# Patient Record
Sex: Female | Born: 1980 | Race: White | Hispanic: No | Marital: Married | Smoking: Former smoker
Health system: Southern US, Community
[De-identification: ages and names within clinical notes are randomized; demographics above are authoritative.]

## PROBLEM LIST (undated history)

## (undated) ENCOUNTER — Inpatient Hospital Stay: Payer: Self-pay

## (undated) DIAGNOSIS — F32A Depression, unspecified: Secondary | ICD-10-CM

## (undated) DIAGNOSIS — R799 Abnormal finding of blood chemistry, unspecified: Secondary | ICD-10-CM

## (undated) DIAGNOSIS — N979 Female infertility, unspecified: Secondary | ICD-10-CM

## (undated) DIAGNOSIS — B079 Viral wart, unspecified: Secondary | ICD-10-CM

## (undated) DIAGNOSIS — F329 Major depressive disorder, single episode, unspecified: Secondary | ICD-10-CM

## (undated) DIAGNOSIS — N926 Irregular menstruation, unspecified: Secondary | ICD-10-CM

## (undated) DIAGNOSIS — F17200 Nicotine dependence, unspecified, uncomplicated: Secondary | ICD-10-CM

## (undated) DIAGNOSIS — M25561 Pain in right knee: Secondary | ICD-10-CM

## (undated) DIAGNOSIS — J309 Allergic rhinitis, unspecified: Secondary | ICD-10-CM

## (undated) DIAGNOSIS — E049 Nontoxic goiter, unspecified: Secondary | ICD-10-CM

## (undated) DIAGNOSIS — D499 Neoplasm of unspecified behavior of unspecified site: Secondary | ICD-10-CM

## (undated) DIAGNOSIS — T7840XA Allergy, unspecified, initial encounter: Secondary | ICD-10-CM

## (undated) DIAGNOSIS — M25562 Pain in left knee: Secondary | ICD-10-CM

## (undated) DIAGNOSIS — O039 Complete or unspecified spontaneous abortion without complication: Secondary | ICD-10-CM

## (undated) HISTORY — DX: Nontoxic goiter, unspecified: E04.9

## (undated) HISTORY — DX: Pain in right knee: M25.562

## (undated) HISTORY — PX: OTHER SURGICAL HISTORY: SHX169

## (undated) HISTORY — DX: Female infertility, unspecified: N97.9

## (undated) HISTORY — DX: Allergy, unspecified, initial encounter: T78.40XA

## (undated) HISTORY — DX: Depression, unspecified: F32.A

## (undated) HISTORY — DX: Major depressive disorder, single episode, unspecified: F32.9

## (undated) HISTORY — DX: Abnormal finding of blood chemistry, unspecified: R79.9

## (undated) HISTORY — DX: Viral wart, unspecified: B07.9

## (undated) HISTORY — DX: Nicotine dependence, unspecified, uncomplicated: F17.200

## (undated) HISTORY — DX: Irregular menstruation, unspecified: N92.6

## (undated) HISTORY — DX: Pain in right knee: M25.561

## (undated) HISTORY — DX: Complete or unspecified spontaneous abortion without complication: O03.9

## (undated) HISTORY — DX: Allergic rhinitis, unspecified: J30.9

## (undated) HISTORY — DX: Neoplasm of unspecified behavior of unspecified site: D49.9

---

## 2004-10-01 ENCOUNTER — Ambulatory Visit: Payer: Self-pay | Admitting: General Practice

## 2007-06-08 ENCOUNTER — Ambulatory Visit: Payer: Self-pay | Admitting: Family Medicine

## 2009-12-02 ENCOUNTER — Ambulatory Visit: Payer: Self-pay | Admitting: Family Medicine

## 2011-11-01 ENCOUNTER — Observation Stay: Payer: Self-pay | Admitting: Obstetrics and Gynecology

## 2011-11-01 ENCOUNTER — Emergency Department: Payer: Self-pay | Admitting: *Deleted

## 2011-11-01 LAB — COMPREHENSIVE METABOLIC PANEL
Albumin: 2.6 g/dL — ABNORMAL LOW (ref 3.4–5.0)
Alkaline Phosphatase: 164 U/L — ABNORMAL HIGH (ref 50–136)
Alkaline Phosphatase: 159 U/L — ABNORMAL HIGH (ref 50–136)
BUN: 5 mg/dL — ABNORMAL LOW (ref 7–18)
Bilirubin,Total: 0.3 mg/dL (ref 0.2–1.0)
Calcium, Total: 8.5 mg/dL (ref 8.5–10.1)
Chloride: 108 mmol/L — ABNORMAL HIGH (ref 98–107)
Co2: 21 mmol/L (ref 21–32)
Creatinine: 0.5 mg/dL — ABNORMAL LOW (ref 0.60–1.30)
Creatinine: 0.47 mg/dL — ABNORMAL LOW (ref 0.60–1.30)
EGFR (African American): >60
EGFR (Non-African Amer.): >60
EGFR (Non-African Amer.): >60
Glucose: 98 mg/dL (ref 65–99)
Osmolality: 273 (ref 275–301)
SGOT(AST): 17 U/L (ref 15–37)
SGPT (ALT): 22 U/L (ref 12–78)
SGPT (ALT): 22 U/L (ref 12–78)
Sodium: 138 mmol/L (ref 136–145)
Total Protein: 6.8 g/dL (ref 6.4–8.2)

## 2011-11-01 LAB — CBC WITH DIFFERENTIAL/PLATELET
Eosinophil #: 0.1 10*3/uL (ref 0.0–0.7)
Eosinophil %: 0.3 %
MCH: 32 pg (ref 26.0–34.0)
MCHC: 35.4 g/dL (ref 32.0–36.0)
Monocyte #: 0.8 x10 3/mm (ref 0.2–0.9)
Neutrophil #: 17.1 10*3/uL — ABNORMAL HIGH (ref 1.4–6.5)
Neutrophil %: 86.1 %
Platelet: 333 10*3/uL (ref 150–440)
RBC: 3.52 10*6/uL — ABNORMAL LOW (ref 3.80–5.20)

## 2011-11-01 LAB — URINALYSIS, COMPLETE
Bilirubin,UR: NEGATIVE
Blood: NEGATIVE
Blood: NEGATIVE
Glucose,UR: NEGATIVE mg/dL (ref 0–75)
Glucose,UR: NEGATIVE mg/dL (ref 0–75)
Leukocyte Esterase: NEGATIVE
Nitrite: NEGATIVE
Nitrite: NEGATIVE
Ph: 7 (ref 4.5–8.0)
Protein: NEGATIVE
Protein: NEGATIVE
RBC,UR: <1 /HPF (ref 0–5)
Specific Gravity: 1.004 (ref 1.003–1.030)
Squamous Epithelial: NONE SEEN
WBC UR: 1 /HPF (ref 0–5)
WBC UR: <1 /HPF (ref 0–5)

## 2011-11-01 LAB — FETAL FIBRONECTIN
Appearance: NORMAL
Fetal Fibronectin: NEGATIVE

## 2011-11-01 LAB — LIPASE, BLOOD: Lipase: 118 U/L (ref 73–393)

## 2011-11-01 LAB — AMYLASE: Amylase: 27 U/L (ref 25–115)

## 2011-11-02 LAB — CBC WITH DIFFERENTIAL/PLATELET
Basophil %: 0.3 %
Eosinophil #: 0.1 10*3/uL (ref 0.0–0.7)
Eosinophil %: 0.8 %
HCT: 27.1 % — ABNORMAL LOW (ref 35.0–47.0)
HGB: 9.6 g/dL — ABNORMAL LOW (ref 12.0–16.0)
Lymphocyte #: 1.8 10*3/uL (ref 1.0–3.6)
MCH: 31.9 pg (ref 26.0–34.0)
MCV: 90 fL (ref 80–100)
Monocyte %: 7.3 %
Neutrophil #: 10.6 10*3/uL — ABNORMAL HIGH (ref 1.4–6.5)
Platelet: 288 10*3/uL (ref 150–440)
RBC: 3.01 10*6/uL — ABNORMAL LOW (ref 3.80–5.20)

## 2011-12-26 ENCOUNTER — Inpatient Hospital Stay: Payer: Self-pay | Admitting: Obstetrics and Gynecology

## 2011-12-27 LAB — CBC WITH DIFFERENTIAL/PLATELET
Basophil %: 0.3 %
Eosinophil #: 0.1 10*3/uL (ref 0.0–0.7)
Eosinophil %: 0.7 %
HCT: 33 % — ABNORMAL LOW (ref 35.0–47.0)
HGB: 11.3 g/dL — ABNORMAL LOW (ref 12.0–16.0)
Lymphocyte %: 14 %
MCH: 31.4 pg (ref 26.0–34.0)
MCHC: 34.3 g/dL (ref 32.0–36.0)
Neutrophil #: 13.5 10*3/uL — ABNORMAL HIGH (ref 1.4–6.5)
Neutrophil %: 79.5 %
Platelet: 332 10*3/uL (ref 150–440)
RBC: 3.61 10*6/uL — ABNORMAL LOW (ref 3.80–5.20)
RDW: 13.6 % (ref 11.5–14.5)
WBC: 16.9 10*3/uL — ABNORMAL HIGH (ref 3.6–11.0)

## 2012-05-16 ENCOUNTER — Other Ambulatory Visit: Payer: Self-pay | Admitting: Family Medicine

## 2012-05-16 ENCOUNTER — Ambulatory Visit
Admission: RE | Admit: 2012-05-16 | Discharge: 2012-05-16 | Disposition: A | Discharge status: Home / Self Care | Payer: Commercial Managed Care - PPO | Source: Ambulatory Visit | Attending: Family Medicine | Admitting: Family Medicine

## 2012-05-16 DIAGNOSIS — G509 Disorder of trigeminal nerve, unspecified: Secondary | ICD-10-CM

## 2012-05-16 MED ORDER — GADOBENATE DIMEGLUMINE 529 MG/ML IV SOLN
16.0000 mL | Freq: Once | INTRAVENOUS | Status: AC | PRN
  Administered 2012-05-16: 16 mL via INTRAVENOUS

## 2012-06-07 ENCOUNTER — Ambulatory Visit (INDEPENDENT_AMBULATORY_CARE_PROVIDER_SITE_OTHER): Payer: Commercial Managed Care - PPO | Admitting: Family Medicine

## 2012-06-07 ENCOUNTER — Encounter: Payer: Self-pay | Admitting: Family Medicine

## 2012-06-07 VITALS — BP 112/70 | HR 86 | Temp 98.9°F | Resp 18 | Ht 65.0 in | Wt 176.0 lb

## 2012-06-07 DIAGNOSIS — Z0000 Encounter for general adult medical examination without abnormal findings: Secondary | ICD-10-CM | POA: Insufficient documentation

## 2012-06-07 DIAGNOSIS — Z01419 Encounter for gynecological examination (general) (routine) without abnormal findings: Secondary | ICD-10-CM | POA: Insufficient documentation

## 2012-06-07 LAB — COMPREHENSIVE METABOLIC PANEL
Alkaline Phosphatase: 96 U/L (ref 39–117)
BUN: 10 mg/dL (ref 6–23)
CO2: 21 mEq/L (ref 19–32)
Creat: 0.73 mg/dL (ref 0.50–1.10)
Glucose, Bld: 82 mg/dL (ref 70–99)
Sodium: 138 mEq/L (ref 135–145)
Total Bilirubin: 0.5 mg/dL (ref 0.3–1.2)

## 2012-06-07 LAB — TSH: TSH: 1.808 u[IU]/mL (ref 0.350–4.500)

## 2012-06-07 LAB — LIPID PANEL
Cholesterol: 183 mg/dL (ref 0–200)
HDL: 35 mg/dL — ABNORMAL LOW (ref >39)
Total CHOL/HDL Ratio: 5.2 Ratio
Triglycerides: 129 mg/dL (ref <150)
VLDL: 26 mg/dL (ref 0–40)

## 2012-06-07 LAB — POCT URINALYSIS DIPSTICK
Ketones, UA: NEGATIVE
Leukocytes, UA: NEGATIVE
Protein, UA: NEGATIVE
pH, UA: 5.5

## 2012-06-07 NOTE — Assessment & Plan Note (Signed)
Completed; pap smear obtained; declined contraception; recommend daily PNV.

## 2012-06-07 NOTE — Assessment & Plan Note (Signed)
Anticipatory guidance -- continued weight loss and exercise. Pap smear obtained; immunizations UTD; obtain labs. Declined contraception; recommend starting PNV.

## 2012-06-07 NOTE — Progress Notes (Signed)
[address]   [place], [place]  [address]   [phone]  Subjective:    Patient ID: Sara Gonzales, female    DOB: 1980/10/31, 32 y.o.   MRN: [id number]  HPI This 32 y.o. female presents to establish care and and CPE.    Last physical 12/2010.   Pap smear 05/2011 normal.  Mammogram never. Colonoscopy never. TDAP [facility] 2008. Flu vaccine 2013. Hepatitis B series x 2 series. Eye exam never; no glasses or contacts. Dental exam one year ago.  S/p NSVD of female infant 12/2011.  Good pregnancy without complications; adjusting well to motherhood.  Good family support. Took three months of maternity leave.  Regular menses; every month; does not desire contraception.    L facial numbness:  Onset one month; intermittent; much milder now; duration now a few seconds. Evaluated by Everlene Other who ordered  MRI brain normal.  Recommended if persists or worsens, then referral to neurology.    Labial swelling: two episodes; duration x 4 days; no itching; painful to wear clothes.  Using detergents for sensitive skin.  No change in soap, lotions.  No medications currently; no similar symptoms.     Review of Systems  Constitutional: Negative for fever, chills, diaphoresis, activity change, appetite change, fatigue and unexpected weight change.  HENT: Negative for hearing loss, ear pain, nosebleeds, congestion, sore throat, facial swelling, rhinorrhea, sneezing, drooling, mouth sores, trouble swallowing, neck pain, neck stiffness, dental problem, voice change, postnasal drip, sinus pressure, tinnitus and ear discharge.   Eyes: Negative for photophobia, pain, discharge, redness, itching and visual disturbance.  Respiratory: Negative for apnea, cough, choking, chest tightness, shortness of breath, wheezing and stridor.   Cardiovascular: Negative for chest pain, palpitations and leg swelling.  Gastrointestinal: Negative for nausea, vomiting, abdominal pain, diarrhea, constipation, blood in stool, abdominal  distention, anal bleeding and rectal pain.  Endocrine: Negative for cold intolerance, heat intolerance, polydipsia, polyphagia and polyuria.  Genitourinary: Negative for dysuria, urgency, frequency, hematuria, flank pain, decreased urine volume, vaginal bleeding, vaginal discharge, enuresis, difficulty urinating, genital sores, menstrual problem and pelvic pain.  Musculoskeletal: Negative for myalgias, back pain, joint swelling, arthralgias and gait problem.  Skin: Negative for color change, pallor, rash and wound.  Allergic/Immunologic: Negative for environmental allergies, food allergies and immunocompromised state.  Neurological: Positive for numbness. Negative for dizziness, tremors, seizures, syncope, facial asymmetry, speech difficulty, weakness, light-headedness and headaches.  Hematological: Negative for adenopathy. Does not bruise/bleed easily.  Psychiatric/Behavioral: Negative for suicidal ideas, hallucinations, behavioral problems, confusion, sleep disturbance, self-injury, dysphoric mood, decreased concentration and agitation. The patient is not nervous/anxious and is not hyperactive.     Past Medical History  Diagnosis Date  . Irregular menses     chronic    History reviewed. No pertinent past surgical history.  Prior to Admission medications   Not on File    Allergies  Allergen Reactions  . Codeine     History   Social History  . Marital Status: Single    Spouse Name: N/A    Number of Children: N/A  . Years of Education: N/A   Occupational History  . Not on file.   Social History Main Topics  . Smoking status: Current Every Day Smoker -- 0.50 packs/day  . Smokeless tobacco: Never Used  . Alcohol Use: No  . Drug Use: No  . Sexually Active: Yes    Birth Control/ Protection: None   Other Topics Concern  . Not on file   Social History Narrative  Marital status: engaged/single.   Dating x 14 years.  No domestic abuse; happy.      Children: one daughter  Victory Dakin)      Lives: with boyfriend, daughter.      Employment: works with [employer]; CMA.      Tobacco: 1/2 ppd      Alcohol: none      Drugs: none      Exercise: walking every night      Seatlbelt: 100%;       Sunscreen: rare sun exposure; no tanning.      Sexual activity: one lifetime partner; no STDs.  No contraception.    Family History  Problem Relation Age of Onset  . Hypotension Mother   . Diabetes Father   . Kidney disease Father   . Diabetes Brother     type I  . Mental illness Brother     mental disorder; mother with custody.       Objective:   Physical Exam  Nursing note and vitals reviewed. Constitutional: She is oriented to person, place, and time. She appears well-developed and well-nourished. No distress.  HENT:  Head: Normocephalic and atraumatic.  Right Ear: External ear normal.  Left Ear: External ear normal.  Nose: Nose normal.  Mouth/Throat: Oropharynx is clear and moist.  Eyes: Conjunctivae and EOM are normal. Pupils are equal, round, and reactive to light.  Neck: Normal range of motion. Neck supple. No JVD present. No thyromegaly present.  Cardiovascular: Normal rate, regular rhythm, normal heart sounds and intact distal pulses.  Exam reveals no gallop and no friction rub.   No murmur heard. Pulmonary/Chest: Effort normal and breath sounds normal. She has no wheezes. She has no rales.  Abdominal: Soft. Bowel sounds are normal. She exhibits no distension and no mass. There is no tenderness. There is no rebound and no guarding.  Genitourinary: Vagina normal and uterus normal. No breast swelling, tenderness, discharge or bleeding. There is no rash, tenderness or lesion on the right labia. There is no rash, tenderness or lesion on the left labia. Cervix exhibits no motion tenderness, no discharge and no friability. Right adnexum displays no mass, no tenderness and no fullness. Left adnexum displays no mass, no tenderness and no fullness. No vaginal discharge  found.  Scant bleeding from os.  Musculoskeletal:       Right shoulder: Normal.       Left shoulder: Normal.       Cervical back: Normal.  Lymphadenopathy:    She has no cervical adenopathy.  Neurological: She is alert and oriented to person, place, and time. She has normal reflexes. No cranial nerve deficit. She exhibits normal muscle tone. Coordination normal.  Skin: Skin is warm and dry. No rash noted. She is not diaphoretic. No erythema. No pallor.  Psychiatric: She has a normal mood and affect. Her behavior is normal. Judgment and thought content normal.        Assessment & Plan:  Routine general medical examination at a health care facility - Plan: POCT urinalysis dipstick, CBC, Vitamin B12, Comprehensive metabolic panel, TSH, Lipid panel, Folate, Vitamin D 25 hydroxy, Hemoglobin A1C  Routine gynecological examination - Plan: Pap IG, CT/NG NAA, and HPV (high risk)

## 2012-06-08 LAB — CBC
HCT: 39.4 % (ref 36.0–46.0)
Hemoglobin: 13.2 g/dL (ref 12.0–15.0)
MCH: 27.7 pg (ref 26.0–34.0)
MCHC: 33.5 g/dL (ref 30.0–36.0)
RDW: 16.1 % — ABNORMAL HIGH (ref 11.5–15.5)

## 2012-06-08 LAB — HEMOGLOBIN A1C: Hgb A1c MFr Bld: 5.9 % — ABNORMAL HIGH (ref <5.7)

## 2012-06-09 LAB — PAP IG, CT-NG NAA, HPV HIGH-RISK: Chlamydia Probe Amp: NEGATIVE

## 2012-06-23 ENCOUNTER — Encounter: Payer: Self-pay | Admitting: *Deleted

## 2012-12-13 ENCOUNTER — Telehealth: Payer: Self-pay | Admitting: *Deleted

## 2012-12-13 NOTE — Telephone Encounter (Signed)
Patient calling needing documentation of her last Tdap. Please fax to work [phone]. Spoke with patient, copied back of the ov from her last Tdap and faxed it to her.

## 2013-03-10 ENCOUNTER — Telehealth: Payer: Self-pay

## 2013-03-10 ENCOUNTER — Ambulatory Visit (INDEPENDENT_AMBULATORY_CARE_PROVIDER_SITE_OTHER): Payer: Commercial Managed Care - PPO | Admitting: Physician Assistant

## 2013-03-10 VITALS — BP 110/70 | HR 98 | Temp 98.1°F | Resp 16 | Ht 66.0 in | Wt 162.0 lb

## 2013-03-10 DIAGNOSIS — Z309 Encounter for contraceptive management, unspecified: Secondary | ICD-10-CM

## 2013-03-10 DIAGNOSIS — IMO0001 Reserved for inherently not codable concepts without codable children: Secondary | ICD-10-CM

## 2013-03-10 LAB — POCT URINE PREGNANCY: PREG TEST UR: NEGATIVE

## 2013-03-10 MED ORDER — NORGESTIM-ETH ESTRAD TRIPHASIC 0.18/0.215/0.25 MG-25 MCG PO TABS: 1.0000 | ORAL_TABLET | Freq: Every day | ORAL | Status: DC

## 2013-03-10 NOTE — Telephone Encounter (Signed)
PATIENT WAS HERE THIS MORNING AND SAYS THAT THE WRONG MEDICATION WAS CALLED IN SHE THINKS IT IS TOO EXPENSIVE AND HER PHARMACY DOES NOT EVEN HAVE IT

## 2013-03-10 NOTE — Progress Notes (Signed)
Subjective:    Patient ID: Sara Gonzales, female    DOB: 1980/04/07, 33 y.o.   MRN: [id number]  HPI Primary Physician: Reginia Forts, MD  Chief Complaint: Contraception   HPI: 33 y.o. female with history below presents for contraception. Requests Ortho Tri Cyclen Lo. In a stable relationship for 14 years. No STDs. Would like to have one more child in a year or two. Wants to take a pill. Not interested in other methods of contraception. She is a smoker. She does not want to quit at this time. Up to date with Pap smear. No other issues.     Past Medical History  Diagnosis Date  . Irregular menses     chronic  . Spontaneous miscarriage   . Female infertility   . Abnormal blood chemistry   . Knee pain, bilateral   . AR (allergic rhinitis)   . Viral warts   . Neoplasm     benign skin  . Goiter   . Tobacco use disorder      Home Meds: Prior to Admission medications   Not on File    Allergies:  Allergies  Allergen Reactions  . Codeine     History   Social History  . Marital Status: Single    Spouse Name: N/A    Number of Children: 1  . Years of Education: 14   Occupational History  . medical assistant    Social History Main Topics  . Smoking status: Current Every Day Smoker -- 0.50 packs/day  . Smokeless tobacco: Never Used  . Alcohol Use: No  . Drug Use: No  . Sexual Activity: Yes    Birth Control/ Protection: None   Other Topics Concern  . Not on file   Social History Narrative   Marital status: engaged/single.   Dating x 14 years.  No domestic abuse; happy.      Children: one daughter [name])      Lives: with boyfriend, daughter.      Employment: works with [employer]; MA.      Tobacco: 1/2 ppd      Alcohol: none      Drugs: none      Exercise: walking every night      Seatlbelt: 100%;       Sunscreen: rare sun exposure; no tanning.      Sexual activity: one lifetime partner; no STDs.  No contraception.     Review of Systems  Constitutional:  Negative for fever, chills and fatigue.  Respiratory: Negative for cough and shortness of breath.   Gastrointestinal: Negative for nausea, abdominal pain and diarrhea.  Skin: Negative for rash.       Objective:   Physical Exam  Physical Exam: Blood pressure 110/70, pulse 98, temperature 98.1 F (36.7 C), temperature source Oral, resp. rate 16, height 5\' 6"  (1.676 m), weight 162 lb (73.483 kg), last menstrual period 03/05/2013, SpO2 99.00%., Body mass index is 26.16 kg/(m^2). General: Well developed, well nourished, in no acute distress. Head: Normocephalic, atraumatic, eyes without discharge, sclera non-icteric, nares are without discharge. Bilateral auditory canals clear, TM's are without perforation, pearly grey and translucent with reflective cone of light bilaterally. Oral cavity moist, posterior pharynx without exudate, erythema, peritonsillar abscess, or post nasal drip. Uvula midline.   Neck: Supple. No thyromegaly. Full ROM. No lymphadenopathy. Lungs: Clear bilaterally to auscultation without wheezes, rales, or rhonchi. Breathing is unlabored. Heart: RRR with S1 S2. No murmurs, rubs, or gallops appreciated. Msk:  Strength and tone normal for  age. Extremities/Skin: Warm and dry. No clubbing or cyanosis. No edema. No rashes or suspicious lesions. Neuro: Alert and oriented X 3. Moves all extremities spontaneously. Gait is normal. CNII-XII grossly in tact. Psych:  Responds to questions appropriately with a normal affect.   Labs: Results for orders placed in visit on 03/10/13  POCT URINE PREGNANCY      Result Value Ref Range   Preg Test, Ur Negative         Assessment & Plan:  33 year old female here for contraception -Ortho Tri Cyclen Lo 1 po daily #1 package RF 11 -Patient requests this OCP -Safe sex practices -Given thromboembolism precautions  -Stop smoking discussion given  Christell Faith, MHS, PA-C [facility], [place]  [id number] [facility] 03/10/2013 8:16 AM

## 2013-08-22 ENCOUNTER — Encounter: Payer: Commercial Managed Care - PPO | Admitting: Family Medicine

## 2013-10-08 ENCOUNTER — Encounter: Payer: Commercial Managed Care - PPO | Admitting: Family Medicine

## 2013-10-17 ENCOUNTER — Ambulatory Visit (INDEPENDENT_AMBULATORY_CARE_PROVIDER_SITE_OTHER): Payer: Commercial Managed Care - PPO | Admitting: Family Medicine

## 2013-10-17 ENCOUNTER — Encounter: Payer: Self-pay | Admitting: Family Medicine

## 2013-10-17 VITALS — BP 122/80 | HR 88 | Temp 98.2°F | Resp 16 | Ht 66.0 in | Wt 175.4 lb

## 2013-10-17 DIAGNOSIS — Z131 Encounter for screening for diabetes mellitus: Secondary | ICD-10-CM

## 2013-10-17 DIAGNOSIS — E559 Vitamin D deficiency, unspecified: Secondary | ICD-10-CM

## 2013-10-17 DIAGNOSIS — Z0000 Encounter for general adult medical examination without abnormal findings: Secondary | ICD-10-CM

## 2013-10-17 DIAGNOSIS — Z3041 Encounter for surveillance of contraceptive pills: Secondary | ICD-10-CM

## 2013-10-17 DIAGNOSIS — E785 Hyperlipidemia, unspecified: Secondary | ICD-10-CM

## 2013-10-17 LAB — CBC WITH DIFFERENTIAL/PLATELET
BASOS PCT: 0 % (ref 0–1)
Basophils Absolute: 0 10*3/uL (ref 0.0–0.1)
EOS ABS: 0.1 10*3/uL (ref 0.0–0.7)
Eosinophils Relative: 1 % (ref 0–5)
HCT: 38.9 % (ref 36.0–46.0)
Hemoglobin: 13.2 g/dL (ref 12.0–15.0)
LYMPHS ABS: 2.7 10*3/uL (ref 0.7–4.0)
Lymphocytes Relative: 28 % (ref 12–46)
MCH: 30.4 pg (ref 26.0–34.0)
MCHC: 33.9 g/dL (ref 30.0–36.0)
MCV: 89.6 fL (ref 78.0–100.0)
Monocytes Absolute: 0.5 10*3/uL (ref 0.1–1.0)
Monocytes Relative: 5 % (ref 3–12)
NEUTROS ABS: 6.3 10*3/uL (ref 1.7–7.7)
NEUTROS PCT: 66 % (ref 43–77)
PLATELETS: 416 10*3/uL — AB (ref 150–400)
RBC: 4.34 MIL/uL (ref 3.87–5.11)
RDW: 13.6 % (ref 11.5–15.5)
WBC: 9.5 10*3/uL (ref 4.0–10.5)

## 2013-10-17 LAB — POCT URINALYSIS DIPSTICK
Bilirubin, UA: NEGATIVE
Glucose, UA: NEGATIVE
Ketones, UA: NEGATIVE
Leukocytes, UA: NEGATIVE
Nitrite, UA: NEGATIVE
PH UA: 5.5
PROTEIN UA: NEGATIVE
RBC UA: NEGATIVE
SPEC GRAV UA: 1.02
UROBILINOGEN UA: 1

## 2013-10-17 MED ORDER — NORGESTIM-ETH ESTRAD TRIPHASIC 0.18/0.215/0.25 MG-25 MCG PO TABS: 1.0000 | ORAL_TABLET | Freq: Every day | ORAL | Status: DC

## 2013-10-17 NOTE — Progress Notes (Signed)
   Subjective:    Patient ID: Sara Gonzales, female    DOB: 03-16-1980, 33 y.o.   MRN: [id number]  HPI    Review of Systems  Constitutional: Negative.   HENT: Negative.   Eyes: Negative.   Respiratory: Negative.   Cardiovascular: Negative.   Gastrointestinal: Negative.   Endocrine: Negative.   Genitourinary: Negative.   Musculoskeletal: Negative.   Skin: Negative.   Allergic/Immunologic: Negative.   Neurological: Negative.   Hematological: Negative.   Psychiatric/Behavioral: Negative.        Objective:   Physical Exam        Assessment & Plan:

## 2013-10-17 NOTE — Patient Instructions (Signed)

## 2013-10-17 NOTE — Progress Notes (Signed)
Patient ID: SAMANATHA BRAMMER, female   DOB: 1980/05/07, 33 y.o.   MRN: [id number]   Subjective:  This chart was scribed for Wardell Honour, MD by Ladene Artist, ED Scribe. The patient was seen in room 21. Patient's care was started at 2:17 PM.   Patient ID: Stephenie Acres, female    DOB: 1980-10-05, 33 y.o.   MRN: [id number]  10/17/2013  Annual Exam  HPI HPI Comments: CHRISTMAS FARACI is a 33 y.o. female who presents to the [facility] for an annual exam. Last CPE June 2014 and pap was normal; HPV negative; GC/Chlam negative. Tdap 04/12/2006. Pt will get her flu vaccine tomorrow at work. Pt had hepatitis B vaccine. No corrective lenses. Last dental exam was prior to the birth of [name] who is 21 m.o. Pt reports that her periods are regular. She bleeds for 5 days, no heavy flow, no cramping. Pt reports having a BM every other day. She denies side effects from [facility] pill that she takes daily at 8 AM. Pt states that she does not wake up during the night to use the restroom. She reports leaking urine only with coughing or sneezing. Pt goes to bed at 11 PM and wakes up at 6 AM. She denies problems falling asleep or staying asleep.  No new health problems developed within past year.   No surgeries. No hospitalizations.   Pt's mother is 42 y.o. with no new health problems, father deceased at 1 y.o. from complications of DM, brother is 69 y.o. with DM and kidney failure; not currently on dialysis since they are awaiting approval before beginning medications, [name] is 24 m.o. Pt lives with her mother, stepdad, brother and [name]. Pt smokes 1 ppd. Occasional alcohol use. No DWIs. No illicit drug use.   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 She denies SI.   Emotional Pt states that she is doing well despite splitting from her ex-boyfriend for years. Pt reports that she left  him since he was cheating on her while she was pregnant. Pt recently signed papers at the beginning of this month for custody. She states that she did not feel safe leaving her child with only him. Pt began dating a guy, Marylyn Ishihara, in 02/1013 who was also cheated on in his past relationship. She got engaged Sunday, 10/14/13. Wedding date is 06/15/2014.   Headaches Pt reports intermittent HAs over the past month. Pain is worse in the forehead region. Pt suspects that HAs may be stress related. HAs do not cause pt to miss work. She has tried ibuprofen with relief.   She denies hearing loss, tinnitus, blurred vision, swelling in neck, chest pain, palpitations, SOB, cough, neck pain, shoulder pain, numbness/tingling, leg swelling, blood in stools, nausea, vomiting, abdominal pain. She also denies snoring and changes to moles.   Pt reports that her paternal aunt was admitted for heart surgery and was diagnosed with unknown cause of kindney failure. Hospice was contacted within 4 months. Aunt has no h/o DM.    Review of Systems  Constitutional: Negative for fever, chills, diaphoresis, activity change, appetite change, fatigue and unexpected weight change.  HENT: Negative for congestion, dental problem, drooling, ear discharge, ear pain, facial swelling, hearing loss, mouth sores, nosebleeds, postnasal drip, rhinorrhea, sinus pressure, sneezing, sore throat, tinnitus, trouble swallowing and voice change.   Eyes: Negative for photophobia, pain, discharge,  redness, itching and visual disturbance.  Respiratory: Negative for apnea, cough, choking, chest tightness, shortness of breath, wheezing and stridor.   Cardiovascular: Negative for chest pain, palpitations and leg swelling.  Gastrointestinal: Negative for nausea, vomiting, abdominal pain, diarrhea, constipation, blood in stool, abdominal distention, anal bleeding and rectal pain.  Endocrine: Negative for cold intolerance, heat intolerance, polydipsia, polyphagia  and polyuria.  Genitourinary: Negative for dysuria, urgency, frequency, hematuria, flank pain, decreased urine volume, vaginal bleeding, vaginal discharge, enuresis, difficulty urinating, genital sores, vaginal pain, menstrual problem, pelvic pain and dyspareunia.  Musculoskeletal: Negative for arthralgias, back pain, gait problem, joint swelling, myalgias, neck pain and neck stiffness.  Skin: Negative for color change, pallor, rash and wound.  Allergic/Immunologic: Negative for environmental allergies, food allergies and immunocompromised state.  Neurological: Positive for headaches (intermittent). Negative for dizziness, tremors, seizures, syncope, facial asymmetry, speech difficulty, weakness, light-headedness and numbness.  Hematological: Negative for adenopathy. Does not bruise/bleed easily.  Psychiatric/Behavioral: Negative for suicidal ideas, hallucinations, behavioral problems, confusion, sleep disturbance, self-injury, dysphoric mood, decreased concentration and agitation. The patient is not nervous/anxious and is not hyperactive.    Past Medical History  Diagnosis Date  . Irregular menses     chronic  . Spontaneous miscarriage   . Female infertility   . Abnormal blood chemistry   . Knee pain, bilateral   . AR (allergic rhinitis)   . Viral warts   . Neoplasm     benign skin  . Goiter   . Tobacco use disorder   . Depression    Past Surgical History  Procedure Laterality Date  . Tubes in ear     Allergies  Allergen Reactions  . Codeine    Current Outpatient Prescriptions  Medication Sig Dispense Refill  . Norgestimate-Ethinyl Estradiol Triphasic (ORTHO TRI-CYCLEN LO) 0.18/0.215/0.25 MG-25 MCG tab Take 1 tablet by mouth daily.  1 Package  11   No current facility-administered medications for this visit.      Objective:    Triage Vitals: BP 122/80  Pulse 88  Temp(Src) 98.2 F (36.8 C) (Oral)  Resp 16  Ht 5\' 6"  (1.676 m)  Wt 175 lb 6.4 oz (79.561 kg)  BMI 28.32  kg/m2  SpO2 98%  LMP 10/17/2013 Physical Exam  Nursing note and vitals reviewed. Constitutional: She is oriented to person, place, and time. She appears well-developed and well-nourished. No distress.  HENT:  Head: Normocephalic and atraumatic.  Right Ear: External ear normal.  Left Ear: External ear normal.  Nose: Nose normal.  Mouth/Throat: Oropharynx is clear and moist.  Eyes: Conjunctivae and EOM are normal. Pupils are equal, round, and reactive to light.  Neck: Normal range of motion and full passive range of motion without pain. Neck supple. No JVD present. Carotid bruit is not present. No thyromegaly present.  asymmetric thyroid R>L.  Cardiovascular: Normal rate, regular rhythm and normal heart sounds.  Exam reveals no gallop and no friction rub.   No murmur heard. Pulmonary/Chest: Effort normal and breath sounds normal. She has no wheezes. She has no rales. Right breast exhibits no inverted nipple, no mass, no nipple discharge, no skin change and no tenderness. Left breast exhibits no inverted nipple, no mass, no nipple discharge, no skin change and no tenderness. Breasts are symmetrical.  Abdominal: Soft. Bowel sounds are normal. She exhibits no distension and no mass. There is no tenderness. There is no rebound and no guarding.  Musculoskeletal: Normal range of motion.       Right shoulder: Normal.  Left shoulder: Normal.       Cervical back: Normal.  Lymphadenopathy:    She has no cervical adenopathy.  Neurological: She is alert and oriented to person, place, and time. She has normal reflexes. No cranial nerve deficit. She exhibits normal muscle tone. Coordination normal.  Skin: Skin is warm and dry. No rash noted. She is not diaphoretic. No erythema. No pallor.  Psychiatric: She has a normal mood and affect. Her behavior is normal. Judgment and thought content normal.   Results for orders placed in visit on 10/17/13  POCT URINALYSIS DIPSTICK      Result Value Ref Range    Color, UA yellow     Clarity, UA clear     Glucose, UA neg     Bilirubin, UA neg     Ketones, UA neg     Spec Grav, UA 1.020     Blood, UA neg     pH, UA 5.5     Protein, UA neg     Urobilinogen, UA 1.0     Nitrite, UA neg     Leukocytes, UA Negative        Assessment & Plan:   1. Routine general medical examination at a health care facility   2. Hyperlipemia   3. Vitamin D deficiency   4. Screening for diabetes mellitus (DM)   5. Encounter for surveillance of contraceptive pills     1. Complete Physical Examination: anticipatory guidance --- weight loss, exercise. Pap smear UTD in 2014; currently on menses thus no pelvic exam completed.  Immunizations UTD: to obtain flu vaccine tomorrow at work.  Safe sexual practices reviewed. 2.  Hyperlipidemia: stable; recommend weight loss, exercise, low-cholesterol food choices. 3.  Vitamin D deficiency: new with last labs; obtain labs. 4.  Screening for diabetes: obtain glucose, HgbA1c. 5.  Contraception management pills: stable; refill provided.   Meds ordered this encounter  Medications  . Norgestimate-Ethinyl Estradiol Triphasic (ORTHO TRI-CYCLEN LO) 0.18/0.215/0.25 MG-25 MCG tab    Sig: Take 1 tablet by mouth daily.    Dispense:  1 Package    Refill:  11    No Follow-up on file.  I personally performed the services described in this documentation, which was scribed in my presence. The recorded information has been reviewed and is accurate.    Reginia Forts, M.D.  [facility] [address] [place], [place]  [address] [phone] phone [phone] fax

## 2013-10-18 LAB — HEMOGLOBIN A1C
Hgb A1c MFr Bld: 5.5 % (ref <5.7)
Mean Plasma Glucose: 111 mg/dL (ref <117)

## 2013-10-18 LAB — COMPLETE METABOLIC PANEL WITH GFR
ALK PHOS: 72 U/L (ref 39–117)
ALT: 15 U/L (ref 0–35)
AST: 13 U/L (ref 0–37)
Albumin: 4 g/dL (ref 3.5–5.2)
BILIRUBIN TOTAL: 0.2 mg/dL (ref 0.2–1.2)
BUN: 11 mg/dL (ref 6–23)
CO2: 24 mEq/L (ref 19–32)
Calcium: 9.3 mg/dL (ref 8.4–10.5)
Chloride: 105 mEq/L (ref 96–112)
Creat: 0.6 mg/dL (ref 0.50–1.10)
GFR, Est African American: >89 mL/min
GFR, Est Non African American: >89 mL/min
Glucose, Bld: 87 mg/dL (ref 70–99)
POTASSIUM: 3.8 meq/L (ref 3.5–5.3)
SODIUM: 138 meq/L (ref 135–145)
TOTAL PROTEIN: 7.1 g/dL (ref 6.0–8.3)

## 2013-10-18 LAB — LIPID PANEL
CHOL/HDL RATIO: 4 ratio
CHOLESTEROL: 190 mg/dL (ref 0–200)
HDL: 47 mg/dL (ref >39)
LDL Cholesterol: 118 mg/dL — ABNORMAL HIGH (ref 0–99)
TRIGLYCERIDES: 123 mg/dL (ref <150)
VLDL: 25 mg/dL (ref 0–40)

## 2013-10-18 LAB — VITAMIN D 25 HYDROXY (VIT D DEFICIENCY, FRACTURES): Vit D, 25-Hydroxy: 53 ng/mL (ref 30–89)

## 2013-10-18 LAB — TSH: TSH: 1.34 u[IU]/mL (ref 0.350–4.500)

## 2013-11-09 ENCOUNTER — Emergency Department: Payer: Self-pay | Admitting: Emergency Medicine

## 2013-11-09 LAB — CBC WITH DIFFERENTIAL/PLATELET
BASOS ABS: 0.1 10*3/uL (ref 0.0–0.1)
BASOS PCT: 0.4 %
EOS PCT: 0.3 %
Eosinophil #: 0 10*3/uL (ref 0.0–0.7)
HCT: 37.6 % (ref 35.0–47.0)
HGB: 12.8 g/dL (ref 12.0–16.0)
Lymphocyte #: 1.2 10*3/uL (ref 1.0–3.6)
Lymphocyte %: 8.7 %
MCH: 30.6 pg (ref 26.0–34.0)
MCHC: 34.1 g/dL (ref 32.0–36.0)
MCV: 90 fL (ref 80–100)
MONO ABS: 0.8 x10 3/mm (ref 0.2–0.9)
MONOS PCT: 5.4 %
NEUTROS ABS: 11.9 10*3/uL — AB (ref 1.4–6.5)
NEUTROS PCT: 85.2 %
PLATELETS: 304 10*3/uL (ref 150–440)
RBC: 4.18 10*6/uL (ref 3.80–5.20)
RDW: 13.5 % (ref 11.5–14.5)
WBC: 13.9 10*3/uL — AB (ref 3.6–11.0)

## 2013-11-09 LAB — BASIC METABOLIC PANEL
ANION GAP: 7 (ref 7–16)
BUN: 7 mg/dL (ref 7–18)
CREATININE: 0.84 mg/dL (ref 0.60–1.30)
Calcium, Total: 8.5 mg/dL (ref 8.5–10.1)
Chloride: 106 mmol/L (ref 98–107)
Co2: 23 mmol/L (ref 21–32)
GLUCOSE: 105 mg/dL — AB (ref 65–99)
Osmolality: 270 (ref 275–301)
POTASSIUM: 3.6 mmol/L (ref 3.5–5.1)
Sodium: 136 mmol/L (ref 136–145)

## 2013-11-09 LAB — URINALYSIS, COMPLETE
BACTERIA: NONE SEEN
Bilirubin,UR: NEGATIVE
Blood: NEGATIVE
Glucose,UR: NEGATIVE mg/dL (ref 0–75)
LEUKOCYTE ESTERASE: NEGATIVE
Nitrite: NEGATIVE
PROTEIN: NEGATIVE
Ph: 6 (ref 4.5–8.0)
RBC,UR: <1 /HPF (ref 0–5)
Specific Gravity: 1.015 (ref 1.003–1.030)
Squamous Epithelial: <1
WBC UR: NONE SEEN /HPF (ref 0–5)

## 2013-11-09 LAB — ED INFLUENZA
H1N1 flu by pcr: NOT DETECTED
Influenza A By PCR: NEGATIVE
Influenza B By PCR: NEGATIVE

## 2014-04-04 ENCOUNTER — Telehealth: Payer: Self-pay

## 2014-04-04 DIAGNOSIS — Z3041 Encounter for surveillance of contraceptive pills: Secondary | ICD-10-CM

## 2014-04-04 MED ORDER — NORGESTIM-ETH ESTRAD TRIPHASIC 0.18/0.215/0.25 MG-25 MCG PO TABS: 1.0000 | ORAL_TABLET | Freq: Every day | ORAL | Status: DC

## 2014-04-04 NOTE — Telephone Encounter (Addendum)
Dose: 1 tablet Route: Oral Frequency: Daily   Dispense Quantity:  1 Package Refills:  11 Fills Remaining:  11          Sig: Take 1 tablet by mouth daily.         Written Date:  10/17/13 Expiration Date:  10/17/14     Start Date:  10/17/13 End Date:  --     Ordering Provider:  -- Authorizing Provider:  Wardell Honour, MD Ordering User:  Wardell Honour, MD          Diagnosis Association: Encounter for surveillance of contraceptive pills (V25.41)              Original Order:  Norgestimate-Ethinyl Estradiol Triphasic (ORTHO TRI-CYCLEN LO) 0.18/0.215/0.25 MG-25 MCG tab [[id number]]        Pharmacy:  [id number] - [place] ([place]), [place] - [address]        REFILL REQUEST PER PHAMACY TO PATIENT

## 2014-04-04 NOTE — Telephone Encounter (Deleted)
TakingNot TakingUnknown Norgestimate-Ethinyl Estradiol Triphasic (ORTHO TRI-CYCLEN LO) 0.18/0.215/0.25 MG-25 MCG tab

## 2014-04-04 NOTE — Telephone Encounter (Signed)
3 month supply sent. Pt notified by VM.

## 2014-05-14 NOTE — H&P (Signed)
L&D Evaluation:  History:   HPI 34 y/o G2P0010 @ 41wks [facility] 12/21/11 arrives for scheduled cervidil and IOL. Care @ [facility] well pregnancy, obesity, GBS+ occasional mild uc', denies leaking fluid or vaginal bleeding, baby is active.    Presents with IOL    Patient's Medical History No Chronic Illness    Patient's Surgical History none    Medications Pre Natal Vitamins    Allergies NKDA    Social History tobacco  5 cigs day   ROS:   ROS All systems were reviewed.  HEENT, CNS, GI, GU, Respiratory, CV, Renal and Musculoskeletal systems were found to be normal.   Exam:   Vital Signs stable    Urine Protein not completed    General no apparent distress    Mental Status clear    Chest clear    Heart normal sinus rhythm    Abdomen gravid, non-tender    Estimated Fetal Weight Average for gestational age    Fetal Position vtx    Fundal Height term    Back no CVAT    Edema no edema    Reflexes 1+    Pelvic no external lesions, 3cm 70% vtx @ -2 BOWI sm show    Mebranes Intact    FHT normal rate with no decels, baseline 130's 140's avg variability with accels    Fetal Heart Rate 132    Ucx occasional    Skin dry    Lymph no lymphadenopathy   Impression:   Impression IOL Postdates   Plan:   Plan EFM/NST, monitor contractions and for cervical change, antibiotics for GBBS prophylaxis    Comments Admitted, explained what to expect with IOL. DC pain management options with labor progress. Husband, family at bedside, supportive.   Electronic Signatures: Rosie Fate (CNM)  (Signed 22-Dec-13 21:19)  Authored: L&D Evaluation   Last Updated: 22-Dec-13 21:19 by Rosie Fate (CNM)

## 2014-05-14 NOTE — H&P (Signed)
L&D Evaluation:  History:   HPI 34 yo G2P0 at 33 weeks with 2 day h/o ruq pain now with rt infrascapular pain. decrease appetite and nausea and vomiting . seen in ED 24 hrs ago and released home. no SOB    Presents with ba    Patient's Medical History No Chronic Illness    Patient's Surgical History none    Medications Pre Natal Vitamins    Allergies Codeine, n/v    Social History none    Family History Non-Contributory   ROS:   ROS All systems were reviewed.  HEENT, CNS, GI, GU, Respiratory, CV, Renal and Musculoskeletal systems were found to be normal.   Exam:   Vital Signs stable    General no apparent distress    Mental Status clear    Abdomen gravid, non-tender    Back no CVAT    FHT normal rate with no decels, reactive    Fetal Heart Rate 140    Ucx irregular    Skin dry   Impression:   Impression 32 weeks with abdominal / back pain . Symptoms c/w cholelithiasis. Differential includes early pyelonephritis.. CTX at 33 weeks probably secondary to dehydration   Plan:   Plan cbc, met c , lipase amylase . Cath UA and rn to check cervix IV fluids NPO. NST. Admit patient for evalaution while work up on going   Louann: Pharmacist, community, Gwen Her (MD)  (Signed 28-Oct-13 13:20)  Authored: L&D Evaluation   Last Updated: 28-Oct-13 13:20 by Boykin Nearing (MD)

## 2014-08-12 ENCOUNTER — Ambulatory Visit (INDEPENDENT_AMBULATORY_CARE_PROVIDER_SITE_OTHER): Payer: Commercial Managed Care - PPO

## 2014-08-12 ENCOUNTER — Ambulatory Visit (INDEPENDENT_AMBULATORY_CARE_PROVIDER_SITE_OTHER): Payer: Commercial Managed Care - PPO | Admitting: Emergency Medicine

## 2014-08-12 VITALS — BP 124/80 | HR 102 | Temp 98.7°F | Resp 18 | Ht 66.25 in | Wt 196.6 lb

## 2014-08-12 DIAGNOSIS — M542 Cervicalgia: Secondary | ICD-10-CM

## 2014-08-12 DIAGNOSIS — M545 Low back pain, unspecified: Secondary | ICD-10-CM

## 2014-08-12 DIAGNOSIS — S335XXA Sprain of ligaments of lumbar spine, initial encounter: Secondary | ICD-10-CM

## 2014-08-12 DIAGNOSIS — S161XXA Strain of muscle, fascia and tendon at neck level, initial encounter: Secondary | ICD-10-CM | POA: Diagnosis not present

## 2014-08-12 MED ORDER — CYCLOBENZAPRINE HCL 10 MG PO TABS: 10.0000 mg | ORAL_TABLET | Freq: Three times a day (TID) | ORAL | Status: DC | PRN

## 2014-08-12 MED ORDER — NAPROXEN SODIUM 550 MG PO TABS: 550.0000 mg | ORAL_TABLET | Freq: Two times a day (BID) | ORAL | Status: DC
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
## 2014-08-12 NOTE — Patient Instructions (Signed)

## 2014-10-23 ENCOUNTER — Encounter: Payer: Commercial Managed Care - PPO | Admitting: Family Medicine

## 2014-10-25 ENCOUNTER — Ambulatory Visit (INDEPENDENT_AMBULATORY_CARE_PROVIDER_SITE_OTHER): Payer: Commercial Managed Care - PPO | Admitting: Family Medicine

## 2014-10-25 ENCOUNTER — Encounter: Payer: Self-pay | Admitting: Family Medicine

## 2014-10-25 VITALS — BP 120/77 | HR 85 | Temp 98.2°F | Resp 16 | Ht 66.5 in | Wt 202.0 lb

## 2014-10-25 DIAGNOSIS — E7800 Pure hypercholesterolemia, unspecified: Secondary | ICD-10-CM | POA: Diagnosis not present

## 2014-10-25 DIAGNOSIS — Z113 Encounter for screening for infections with a predominantly sexual mode of transmission: Secondary | ICD-10-CM

## 2014-10-25 DIAGNOSIS — Z3009 Encounter for other general counseling and advice on contraception: Secondary | ICD-10-CM

## 2014-10-25 DIAGNOSIS — E669 Obesity, unspecified: Secondary | ICD-10-CM

## 2014-10-25 DIAGNOSIS — Z0000 Encounter for general adult medical examination without abnormal findings: Secondary | ICD-10-CM | POA: Diagnosis not present

## 2014-10-25 DIAGNOSIS — Z6832 Body mass index (BMI) 32.0-32.9, adult: Secondary | ICD-10-CM

## 2014-10-25 DIAGNOSIS — Z131 Encounter for screening for diabetes mellitus: Secondary | ICD-10-CM | POA: Diagnosis not present

## 2014-10-25 DIAGNOSIS — Z716 Tobacco abuse counseling: Secondary | ICD-10-CM | POA: Diagnosis not present

## 2014-10-25 LAB — CBC WITH DIFFERENTIAL/PLATELET
BASOS ABS: 0 10*3/uL (ref 0.0–0.1)
Basophils Relative: 0 % (ref 0–1)
Eosinophils Absolute: 0.1 10*3/uL (ref 0.0–0.7)
Eosinophils Relative: 1 % (ref 0–5)
HCT: 39.7 % (ref 36.0–46.0)
Hemoglobin: 13.7 g/dL (ref 12.0–15.0)
LYMPHS PCT: 27 % (ref 12–46)
Lymphs Abs: 2.5 10*3/uL (ref 0.7–4.0)
MCH: 30.9 pg (ref 26.0–34.0)
MCHC: 34.5 g/dL (ref 30.0–36.0)
MCV: 89.4 fL (ref 78.0–100.0)
MPV: 9.1 fL (ref 8.6–12.4)
Monocytes Absolute: 0.6 10*3/uL (ref 0.1–1.0)
Monocytes Relative: 6 % (ref 3–12)
Neutro Abs: 6.2 10*3/uL (ref 1.7–7.7)
Neutrophils Relative %: 66 % (ref 43–77)
Platelets: 425 10*3/uL — ABNORMAL HIGH (ref 150–400)
RBC: 4.44 MIL/uL (ref 3.87–5.11)
RDW: 13.4 % (ref 11.5–15.5)
WBC: 9.4 10*3/uL (ref 4.0–10.5)

## 2014-10-25 LAB — POCT URINALYSIS DIP (MANUAL ENTRY)
BILIRUBIN UA: NEGATIVE
BILIRUBIN UA: NEGATIVE
Glucose, UA: NEGATIVE
Leukocytes, UA: NEGATIVE
NITRITE UA: NEGATIVE
Protein Ur, POC: NEGATIVE
RBC UA: NEGATIVE
Spec Grav, UA: 1.01
Urobilinogen, UA: 0.2
pH, UA: 7

## 2014-10-25 LAB — LIPID PANEL
CHOLESTEROL: 176 mg/dL (ref 125–200)
HDL: 33 mg/dL — ABNORMAL LOW (ref >=46)
LDL Cholesterol: 106 mg/dL (ref <130)
TRIGLYCERIDES: 185 mg/dL — AB (ref <150)
Total CHOL/HDL Ratio: 5.3 Ratio — ABNORMAL HIGH (ref <=5.0)
VLDL: 37 mg/dL — ABNORMAL HIGH (ref <30)

## 2014-10-25 LAB — COMPREHENSIVE METABOLIC PANEL
ALBUMIN: 4 g/dL (ref 3.6–5.1)
ALT: 22 U/L (ref 6–29)
AST: 16 U/L (ref 10–30)
Alkaline Phosphatase: 71 U/L (ref 33–115)
BUN: 9 mg/dL (ref 7–25)
CO2: 26 mmol/L (ref 20–31)
Calcium: 9.5 mg/dL (ref 8.6–10.2)
Chloride: 106 mmol/L (ref 98–110)
Creat: 0.66 mg/dL (ref 0.50–1.10)
Glucose, Bld: 80 mg/dL (ref 65–99)
POTASSIUM: 4.3 mmol/L (ref 3.5–5.3)
Sodium: 138 mmol/L (ref 135–146)
TOTAL PROTEIN: 7.2 g/dL (ref 6.1–8.1)
Total Bilirubin: 0.3 mg/dL (ref 0.2–1.2)

## 2014-10-25 LAB — HEMOGLOBIN A1C
HEMOGLOBIN A1C: 5.4 % (ref <5.7)
MEAN PLASMA GLUCOSE: 108 mg/dL (ref <117)

## 2014-10-25 LAB — TSH: TSH: 1.331 u[IU]/mL (ref 0.350–4.500)

## 2014-10-25 LAB — HIV ANTIBODY (ROUTINE TESTING W REFLEX): HIV 1&2 Ab, 4th Generation: NONREACTIVE

## 2014-10-25 LAB — RPR

## 2014-10-25 NOTE — Patient Instructions (Signed)

## 2014-10-25 NOTE — Progress Notes (Signed)
Subjective:    Patient ID: Sara Gonzales, female    DOB: 06-Dec-1980, 34 y.o.   MRN: [id number]  HPI This 34 y.o. female presents for Complete Physical Examination.   Last physical:  10-17-2013 Pap smear:  06-07-2012  WNL HPV negative; regular (6 days, no cramping; no heaviness) TDAP:  2008 Influenza:  10-18-2014 at work Eye exam:  Never; no glasses. Dental exam:  Just went.  Aunt with kidney failure no DMI; age 22.  Brother with Stage IV kidney failure with DMII: father with stage IV kidney failure DMI.   Review of Systems  Constitutional: Negative.  Negative for fever, chills, diaphoresis, activity change, appetite change, fatigue and unexpected weight change.  HENT: Negative.  Negative for congestion, dental problem, drooling, ear discharge, ear pain, facial swelling, hearing loss, mouth sores, nosebleeds, postnasal drip, rhinorrhea, sinus pressure, sneezing, sore throat, tinnitus, trouble swallowing and voice change.   Eyes: Negative.  Negative for photophobia, pain, discharge, redness, itching and visual disturbance.  Respiratory: Negative.  Negative for apnea, cough, choking, chest tightness, shortness of breath, wheezing and stridor.   Cardiovascular: Negative.  Negative for chest pain, palpitations and leg swelling.  Gastrointestinal: Negative.  Negative for nausea, vomiting, abdominal pain, diarrhea, constipation, blood in stool, abdominal distention, anal bleeding and rectal pain.  Endocrine: Negative.  Negative for cold intolerance, heat intolerance, polydipsia, polyphagia and polyuria.  Genitourinary: Negative.  Negative for dysuria, urgency, frequency, hematuria, flank pain, decreased urine volume, vaginal bleeding, vaginal discharge, enuresis, difficulty urinating, genital sores, vaginal pain, menstrual problem, pelvic pain and dyspareunia.  Musculoskeletal: Negative.  Negative for myalgias, back pain, joint swelling, arthralgias, gait problem, neck pain and neck stiffness.    Skin: Negative.  Negative for color change, pallor, rash and wound.  Allergic/Immunologic: Negative.  Negative for environmental allergies, food allergies and immunocompromised state.  Neurological: Negative.  Negative for dizziness, tremors, seizures, syncope, facial asymmetry, speech difficulty, weakness, light-headedness, numbness and headaches.  Hematological: Negative.  Negative for adenopathy. Does not bruise/bleed easily.  Psychiatric/Behavioral: Negative.  Negative for suicidal ideas, hallucinations, behavioral problems, confusion, sleep disturbance, self-injury, dysphoric mood, decreased concentration and agitation. The patient is not nervous/anxious and is not hyperactive.    Past Medical History  Diagnosis Date  . Irregular menses     chronic  . Spontaneous miscarriage   . Female infertility   . Abnormal blood chemistry   . Knee pain, bilateral   . AR (allergic rhinitis)   . Viral warts   . Neoplasm     benign skin  . Goiter   . Tobacco use disorder   . Depression   . Allergy    Past Surgical History  Procedure Laterality Date  . Tubes in ear     Allergies  Allergen Reactions  . Codeine    Social History   Social History  . Marital Status: Single    Spouse Name: N/A  . Number of Children: 1  . Years of Education: 14   Occupational History  . medical assistant    Social History Main Topics  . Smoking status: Current Every Day Smoker -- 0.50 packs/day for 14 years  . Smokeless tobacco: Never Used  . Alcohol Use: No  . Drug Use: No  . Sexual Activity: Yes    Birth Control/ Protection: None   Other Topics Concern  . Not on file   Social History Narrative   Marital status: married.   Dating x 1 year.  No domestic abuse; happy.  Children: one daughter [name] 32 months)      Lives: husband, daughter, step daughter Jillyn Ledger)      Employment: works with [employer]; MA. Full time.      Tobacco: 1 ppd; down to 1/2 ppd in 2016; smoking x 14 years.       Alcohol: socially; no DWIs.      Drugs: none      Exercise: not currently      Seatlbelt: 100%;       Sunscreen: rare sun exposure; no tanning.      Sexual activity: two total lifetime partner; no STDs.  No contraception.   Family History  Problem Relation Age of Onset  . Hypotension Mother     also hypoglycemia  . Migraines Mother   . Diabetes Father   . Kidney disease Father   . Diabetes Brother     type I  . Mental illness Brother     mental disorder; mother with custody.  . Kidney failure Brother        Objective:   Physical Exam  Constitutional: She is oriented to person, place, and time. She appears well-developed and well-nourished. No distress.  HENT:  Head: Normocephalic and atraumatic.  Right Ear: External ear normal.  Left Ear: External ear normal.  Nose: Nose normal.  Mouth/Throat: Oropharynx is clear and moist.  Eyes: Conjunctivae and EOM are normal. Pupils are equal, round, and reactive to light.  Neck: Normal range of motion and full passive range of motion without pain. Neck supple. No JVD present. Carotid bruit is not present. No thyromegaly present.  Cardiovascular: Normal rate, regular rhythm and normal heart sounds.  Exam reveals no gallop and no friction rub.   No murmur heard. Pulmonary/Chest: Effort normal and breath sounds normal. She has no wheezes. She has no rales.  Abdominal: Soft. Bowel sounds are normal. She exhibits no distension and no mass. There is no tenderness. There is no rebound and no guarding.  Musculoskeletal:       Right shoulder: Normal.       Left shoulder: Normal.       Cervical back: Normal.  Lymphadenopathy:    She has no cervical adenopathy.  Neurological: She is alert and oriented to person, place, and time. She has normal reflexes. No cranial nerve deficit. She exhibits normal muscle tone. Coordination normal.  Skin: Skin is warm and dry. No rash noted. She is not diaphoretic. No erythema. No pallor.  Psychiatric: She has  a normal mood and affect. Her behavior is normal. Judgment and thought content normal.  Nursing note and vitals reviewed.  Results for orders placed or performed in visit on 10/25/14  POCT urinalysis dipstick  Result Value Ref Range   Color, UA yellow yellow   Clarity, UA clear clear   Glucose, UA negative negative   Bilirubin, UA negative negative   Ketones, POC UA negative negative   Spec Grav, UA 1.010    Blood, UA negative negative   pH, UA 7.0    Protein Ur, POC negative negative   Urobilinogen, UA 0.2    Nitrite, UA Negative Negative   Leukocytes, UA Negative Negative       Assessment & Plan:   1. Routine physical examination   2. Pure hypercholesterolemia   3. Screening for diabetes mellitus   4. Obesity   5. Screening for STD (sexually transmitted disease)   6. Tobacco abuse counseling   7. Family planning counseling     1. Complete Physical  Examination:  Anticipatory guidance --- exercise, weight loss, smoking cessation. Pap smear UTD in 2014.  No history of STDs or abnormal pap smears.   2. Hypercholesterolemia: stable; weight loss, exercise, low-cholesterol food choices. 3.  Screening DMII: obtain glucose, HgbA1c. 4. Obesity: 35 pound weight gain in past year; weight loss, exercise, low-caloric food choices. 5.  Tobacco abuse: encourage cessation. 6.  Screening HIV/STD: obtain HIV, RPRP. 7. Family planning counseling: encourage daily PNV.  Meds ordered this encounter  Medications  . Prenatal Vit-Fe Fumarate-FA (PRENATAL MULTIVITAMIN) TABS tablet    Sig: Take 1 tablet by mouth daily at 12 noon.   Orders Placed This Encounter  Procedures  . CBC with Differential/Platelet  . Comprehensive metabolic panel  . Hemoglobin A1c  . Lipid panel  . TSH  . HIV antibody  . RPR  . POCT urinalysis dipstick   Norwood Levo, M.D. [facility] [address] [place], [place]  [address] [phone] phone [phone] fax

## 2015-01-05 NOTE — L&D Delivery Note (Addendum)
Delivery Note At 6:20 PM a viable and healthy female "Sara Gonzales" was delivered via Vaginal, Spontaneous Delivery (Presentation: OA ).  APGAR: 8, 9; weight 8 lb 1.5 oz (3670 g).   Placenta status: intact - marginal cord insertion with heart-shaped uterus, .  Cord: 3VC  Anesthesia:  Epidural, local Episiotomy:  none Lacerations:  2nd deg Suture Repair: 2.0 vicryl Est. Blood Loss (mL):  1951ml  Mom to postpartum.  Baby to Couplet care / Skin to Skin.  35yo G3P1011 at 39+2wks augmentation of labor for acute onset idiopathic chest pain with tachypnea with normal imaging. She was AROM for meconium stained fluid. With epidural, entered the second stage of labor and began to push over intact perineum with excellent maternal effort. The anterior shoulder delivered easily and immediately. Baby placed on maternal abdomen and was vigorously crying promptly.   Placenta delivered spontaneously and is intact and heart-shaped. No trailing membranes. However, continued bleeding with uterine atony that improved with manual evacuation of large clots all the way to the fundus. She received cytotec, methergine and hemabate IM, as well as 40U of pitocin, with temporary improvement. Exam revealed no lacerations. 2nd deg repair completed during this time.   Decision made to proceed with Bakri balloon placement in the LDR. Foley placed in bladder. A total of 114ml fit into the lower uterine segment. Pt tolerated these procedures well. No bleeding at the conclusion. Will monitor bleeding overnight.  Po methergine x24hrs and iv ancef x3 doses. Plan for removal of Bakri balloon in 12-24hrs.  Quantitative blood loss revealed total of 199ml lost. Labs in am.  Continue tele monitoring overnight until Bakri removed.   By the time the patient had her epidural, her chest pain had resolved. Her tachypnea did continue in the 20s throughout her delivery. Plan for bilateral LE dopplers with edema and elevated DDimer.  Benjaman Kindler 10/23/2015, 7:44 PM

## 2015-01-22 ENCOUNTER — Ambulatory Visit
Admission: EM | Admit: 2015-01-22 | Discharge: 2015-01-22 | Disposition: A | Discharge status: Home / Self Care | Payer: Commercial Managed Care - PPO | Attending: Family Medicine | Admitting: Family Medicine

## 2015-01-22 DIAGNOSIS — J0100 Acute maxillary sinusitis, unspecified: Secondary | ICD-10-CM | POA: Diagnosis not present

## 2015-01-22 DIAGNOSIS — H66002 Acute suppurative otitis media without spontaneous rupture of ear drum, left ear: Secondary | ICD-10-CM | POA: Diagnosis not present

## 2015-01-22 DIAGNOSIS — J039 Acute tonsillitis, unspecified: Secondary | ICD-10-CM | POA: Diagnosis not present

## 2015-01-22 DIAGNOSIS — H6591 Unspecified nonsuppurative otitis media, right ear: Secondary | ICD-10-CM

## 2015-01-22 MED ORDER — AMOXICILLIN-POT CLAVULANATE 875-125 MG PO TABS: 1.0000 | ORAL_TABLET | Freq: Two times a day (BID) | ORAL | Status: DC

## 2015-01-22 MED ORDER — ACETAMINOPHEN 500 MG PO TABS: 1000.0000 mg | ORAL_TABLET | Freq: Four times a day (QID) | ORAL | Status: AC | PRN

## 2015-01-22 MED ORDER — FLUTICASONE PROPIONATE 50 MCG/ACT NA SUSP: 1.0000 | Freq: Two times a day (BID) | NASAL | Status: DC

## 2015-01-22 NOTE — ED Notes (Signed)
States started Friday nasal congestion. Cough started Monday. Fever started yesterday.

## 2015-01-22 NOTE — Discharge Instructions (Signed)
Otitis Media, Adult Otitis media is redness, soreness, and inflammation of the middle ear. Otitis media may be caused by allergies or, most commonly, by infection. Often it occurs as a complication of the common cold. SIGNS AND SYMPTOMS Symptoms of otitis media may include:  Earache.  Fever.  Ringing in your ear.  Headache.  Leakage of fluid from the ear. DIAGNOSIS To diagnose otitis media, your health care provider will examine your ear with an otoscope. This is an instrument that allows your health care provider to see into your ear in order to examine your eardrum. Your health care provider also will ask you questions about your symptoms. TREATMENT  Typically, otitis media resolves on its own within 3-5 days. Your health care provider may prescribe medicine to ease your symptoms of pain. If otitis media does not resolve within 5 days or is recurrent, your health care provider may prescribe antibiotic medicines if he or she suspects that a bacterial infection is the cause. HOME CARE INSTRUCTIONS   If you were prescribed an antibiotic medicine, finish it all even if you start to feel better.  Take medicines only as directed by your health care provider.  Keep all follow-up visits as directed by your health care provider. SEEK MEDICAL CARE IF:  You have otitis media only in one ear, or bleeding from your nose, or both.  You notice a lump on your neck.  You are not getting better in 3-5 days.  You feel worse instead of better. SEEK IMMEDIATE MEDICAL CARE IF:   You have pain that is not controlled with medicine.  You have swelling, redness, or pain around your ear or stiffness in your neck.  You notice that part of your face is paralyzed.  You notice that the bone behind your ear (mastoid) is tender when you touch it. MAKE SURE YOU:   Understand these instructions.  Will watch your condition.  Will get help right away if you are not doing well or get worse.   This  information is not intended to replace advice given to you by your health care provider. Make sure you discuss any questions you have with your health care provider.   Document Released: 09/26/2003 Document Revised: 01/11/2014 Document Reviewed: 07/18/2012 Elsevier Interactive Patient Education 2016 [employer] [employer]. Otitis Media With Effusion Otitis media with effusion is the presence of fluid in the middle ear. This is a common problem in children, which often follows ear infections. It may be present for weeks or longer after the infection. Unlike an acute ear infection, otitis media with effusion refers only to fluid behind the ear drum and not infection. Children with repeated ear and sinus infections and allergy problems are the most likely to get otitis media with effusion. CAUSES  The most frequent cause of the fluid buildup is dysfunction of the eustachian tubes. These are the tubes that drain fluid in the ears to the back of the nose (nasopharynx). SYMPTOMS   The main symptom of this condition is hearing loss. As a result, you or your child may:  Listen to the TV at a loud volume.  Not respond to questions.  Ask "what" often when spoken to.  Mistake or confuse one sound or word for another.  There may be a sensation of fullness or pressure but usually not pain. DIAGNOSIS   Your health care provider will diagnose this condition by examining you or your child's ears.  Your health care provider may test the pressure in you  or your child's ear with a tympanometer.  A hearing test may be conducted if the problem persists. TREATMENT   Treatment depends on the duration and the effects of the effusion.  Antibiotics, decongestants, nose drops, and cortisone-type drugs (tablets or nasal spray) may not be helpful.  Children with persistent ear effusions may have delayed language or behavioral problems. Children at risk for developmental delays in hearing, learning, and speech may  require referral to a specialist earlier than children not at risk.  You or your child's health care provider may suggest a referral to an ear, nose, and throat surgeon for treatment. The following may help restore normal hearing:  Drainage of fluid.  Placement of ear tubes (tympanostomy tubes).  Removal of adenoids (adenoidectomy). HOME CARE INSTRUCTIONS   Avoid secondhand smoke.  Infants who are breastfed are less likely to have this condition.  Avoid feeding infants while they are lying flat.  Avoid known environmental allergens.  Avoid people who are sick. SEEK MEDICAL CARE IF:   Hearing is not better in 3 months.  Hearing is worse.  Ear pain.  Drainage from the ear.  Dizziness. MAKE SURE YOU:   Understand these instructions.  Will watch your condition.  Will get help right away if you are not doing well or get worse.   This information is not intended to replace advice given to you by your health care provider. Make sure you discuss any questions you have with your health care provider.   Document Released: 01/29/2004 Document Revised: 01/11/2014 Document Reviewed: 07/18/2012 Elsevier Interactive Patient Education 2016 [employer] Sinusitis, Adult Sinusitis is redness, soreness, and inflammation of the paranasal sinuses. Paranasal sinuses are air pockets within the bones of your face. They are located beneath your eyes, in the middle of your forehead, and above your eyes. In healthy paranasal sinuses, mucus is able to drain out, and air is able to circulate through them by way of your nose. However, when your paranasal sinuses are inflamed, mucus and air can become trapped. This can allow bacteria and other germs to grow and cause infection. Sinusitis can develop quickly and last only a short time (acute) or continue over a long period (chronic). Sinusitis that lasts for more than 12 weeks is considered chronic. CAUSES Causes of sinusitis  include:  Allergies.  Structural abnormalities, such as displacement of the cartilage that separates your nostrils (deviated septum), which can decrease the air flow through your nose and sinuses and affect sinus drainage.  Functional abnormalities, such as when the small hairs (cilia) that line your sinuses and help remove mucus do not work properly or are not present. SIGNS AND SYMPTOMS Symptoms of acute and chronic sinusitis are the same. The primary symptoms are pain and pressure around the affected sinuses. Other symptoms include:  Upper toothache.  Earache.  Headache.  Bad breath.  Decreased sense of smell and taste.  A cough, which worsens when you are lying flat.  Fatigue.  Fever.  Thick drainage from your nose, which often is green and may contain pus (purulent).  Swelling and warmth over the affected sinuses. DIAGNOSIS Your health care provider will perform a physical exam. During your exam, your health care provider may perform any of the following to help determine if you have acute sinusitis or chronic sinusitis:  Look in your nose for signs of abnormal growths in your nostrils (nasal polyps).  Tap over the affected sinus to check for signs of infection.  View the inside of your  sinuses using an imaging device that has a light attached (endoscope). If your health care provider suspects that you have chronic sinusitis, one or more of the following tests may be recommended:  Allergy tests.  Nasal culture. A sample of mucus is taken from your nose, sent to a lab, and screened for bacteria.  Nasal cytology. A sample of mucus is taken from your nose and examined by your health care provider to determine if your sinusitis is related to an allergy. TREATMENT Most cases of acute sinusitis are related to a viral infection and will resolve on their own within 10 days. Sometimes, medicines are prescribed to help relieve symptoms of both acute and chronic sinusitis.  These may include pain medicines, decongestants, nasal steroid sprays, or saline sprays. However, for sinusitis related to a bacterial infection, your health care provider will prescribe antibiotic medicines. These are medicines that will help kill the bacteria causing the infection. Rarely, sinusitis is caused by a fungal infection. In these cases, your health care provider will prescribe antifungal medicine. For some cases of chronic sinusitis, surgery is needed. Generally, these are cases in which sinusitis recurs more than 3 times per year, despite other treatments. HOME CARE INSTRUCTIONS  Drink plenty of water. Water helps thin the mucus so your sinuses can drain more easily.  Use a humidifier.  Inhale steam 3-4 times a day (for example, sit in the bathroom with the shower running).  Apply a warm, moist washcloth to your face 3-4 times a day, or as directed by your health care provider.  Use saline nasal sprays to help moisten and clean your sinuses.  Take medicines only as directed by your health care provider.  If you were prescribed either an antibiotic or antifungal medicine, finish it all even if you start to feel better. SEEK IMMEDIATE MEDICAL CARE IF:  You have increasing pain or severe headaches.  You have nausea, vomiting, or drowsiness.  You have swelling around your face.  You have vision problems.  You have a stiff neck.  You have difficulty breathing.   This information is not intended to replace advice given to you by your health care provider. Make sure you discuss any questions you have with your health care provider.   Document Released: 12/21/2004 Document Revised: 01/11/2014 Document Reviewed: 01/05/2011 Elsevier Interactive Patient Education 2016 [employer] Tonsillitis Tonsillitis is an infection of the throat that causes the tonsils to become red, tender, and swollen. Tonsils are collections of lymphoid tissue at the back of the throat. Each tonsil  has crevices (crypts). Tonsils help fight nose and throat infections and keep infection from spreading to other parts of the body for the first 18 months of life.  CAUSES Sudden (acute) tonsillitis is usually caused by infection with streptococcal bacteria. Long-lasting (chronic) tonsillitis occurs when the crypts of the tonsils become filled with pieces of food and bacteria, which makes it easy for the tonsils to become repeatedly infected. SYMPTOMS  Symptoms of tonsillitis include:  A sore throat, with possible difficulty swallowing.  White patches on the tonsils.  Fever.  Tiredness.  New episodes of snoring during sleep, when you did not snore before.  Small, foul-smelling, yellowish-white pieces of material (tonsilloliths) that you occasionally cough up or spit out. The tonsilloliths can also cause you to have bad breath. DIAGNOSIS Tonsillitis can be diagnosed through a physical exam. Diagnosis can be confirmed with the results of lab tests, including a throat culture. TREATMENT  The goals of tonsillitis treatment include  the reduction of the severity and duration of symptoms and prevention of associated conditions. Symptoms of tonsillitis can be improved with the use of steroids to reduce the swelling. Tonsillitis caused by bacteria can be treated with antibiotic medicines. Usually, treatment with antibiotic medicines is started before the cause of the tonsillitis is known. However, if it is determined that the cause is not bacterial, antibiotic medicines will not treat the tonsillitis. If attacks of tonsillitis are severe and frequent, your health care provider may recommend surgery to remove the tonsils (tonsillectomy). HOME CARE INSTRUCTIONS   Rest as much as possible and get plenty of sleep.  Drink plenty of fluids. While the throat is very sore, eat soft foods or liquids, such as sherbet, soups, or instant breakfast drinks.  Eat frozen ice pops.  Gargle with a warm or cold  liquid to help soothe the throat. Mix 1/4 teaspoon of salt and 1/4 teaspoon of baking soda in 8 oz of water. SEEK MEDICAL CARE IF:   Large, tender lumps develop in your neck.  A rash develops.  A green, yellow-brown, or bloody substance is coughed up.  You are unable to swallow liquids or food for 24 hours.  You notice that only one of the tonsils is swollen. SEEK IMMEDIATE MEDICAL CARE IF:   You develop any new symptoms such as vomiting, severe headache, stiff neck, chest pain, or trouble breathing or swallowing.  You have severe throat pain along with drooling or voice changes.  You have severe pain, unrelieved with recommended medications.  You are unable to fully open the mouth.  You develop redness, swelling, or severe pain anywhere in the neck.  You have a fever. MAKE SURE YOU:   Understand these instructions.  Will watch your condition.  Will get help right away if you are not doing well or get worse.   This information is not intended to replace advice given to you by your health care provider. Make sure you discuss any questions you have with your health care provider.   Document Released: 09/30/2004 Document Revised: 01/11/2014 Document Reviewed: 06/09/2012 Elsevier Interactive Patient Education [employer].

## 2015-01-22 NOTE — ED Provider Notes (Signed)
CSN: [id number]     Arrival date & time 01/22/15  1511 History   First MD Initiated Contact with Patient 01/22/15 1526     Chief Complaint  Patient presents with  . Facial Pain   (Consider location/radiation/quality/duration/timing/severity/associated sxs/prior Treatment) HPI Comments: Married caucasian female here for evaluation of sinus pressure, left ear pain, rhinitis since 17 Jan 2015. 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  Has tried zyrtec, saline and afrin for her symptoms without much relief.    The history is provided by the patient and the spouse.    Past Medical History  Diagnosis Date  . Irregular menses     chronic  . Spontaneous miscarriage   . Female infertility   . Abnormal blood chemistry   . Knee pain, bilateral   . AR (allergic rhinitis)   . Viral warts   . Neoplasm     benign skin  . Goiter   . Tobacco use disorder   . Depression   . Allergy    Past Surgical History  Procedure Laterality Date  . Tubes in ear     Family History  Problem Relation Age of Onset  . Hypotension Mother     also hypoglycemia  . Migraines Mother   . Diabetes Father   . Kidney disease Father   . Diabetes Brother     type I  . Mental illness Brother     mental disorder; mother with custody.  . Kidney failure Brother    Social History  Substance Use Topics  . Smoking status: Current Every Day Smoker -- 1.00 packs/day for 14 years  . Smokeless tobacco: Never Used  . Alcohol Use: Yes     Comment: socially   OB History    No data available     Review of Systems  Constitutional: Positive for fever. Negative for chills, diaphoresis, activity change, appetite change, fatigue and unexpected weight change.  HENT: Positive for congestion, ear  pain, postnasal drip, rhinorrhea, sinus pressure and sore throat. Negative for dental problem, drooling, ear discharge, facial swelling, hearing loss, mouth sores, nosebleeds, sneezing, tinnitus, trouble swallowing and voice change.   Eyes: Negative for photophobia, pain, discharge, redness, itching and visual disturbance.  Respiratory: Negative for cough, choking, chest tightness, shortness of breath, wheezing and stridor.   Cardiovascular: Negative for chest pain, palpitations and leg swelling.  Gastrointestinal: Negative for nausea, vomiting, abdominal pain, diarrhea, constipation, blood in stool and abdominal distention.  Endocrine: Negative for cold intolerance and heat intolerance.  Genitourinary: Negative for dysuria, hematuria and difficulty urinating.  Musculoskeletal: Negative for myalgias, back pain, joint swelling, arthralgias, gait problem, neck pain and neck stiffness.  Skin: Negative for color change, pallor, rash and wound.  Allergic/Immunologic: Positive for environmental allergies. Negative for food allergies.  Neurological: Positive for headaches. Negative for dizziness, tremors, seizures, syncope, facial asymmetry, speech difficulty, weakness, light-headedness and numbness.  Hematological: Negative for adenopathy. Does not bruise/bleed easily.  Psychiatric/Behavioral: Negative for behavioral problems, confusion, sleep disturbance and agitation.    Allergies  Codeine  Home Medications   Prior to Admission medications   Medication Sig Start Date End Date Taking? Authorizing Provider  acetaminophen (TYLENOL) 500 MG tablet Take 2 tablets (1,000 mg total) by mouth every 6 (six) hours as needed for mild pain,  moderate pain, fever or headache. 01/22/15 01/25/15  Olen Cordial, NP  amoxicillin-clavulanate (AUGMENTIN) 875-125 MG tablet Take 1 tablet by mouth every 12 (twelve) hours. 01/22/15   Olen Cordial, NP  fluticasone (FLONASE) 50 MCG/ACT nasal spray Place 1 spray into  both nostrils 2 (two) times daily. 01/22/15   Olen Cordial, NP  Prenatal Vit-Fe Fumarate-FA (PRENATAL MULTIVITAMIN) TABS tablet Take 1 tablet by mouth daily at 12 noon.    Historical Provider, MD   Meds Ordered and Administered this Visit  Medications - No data to display  BP 113/75 mmHg  Pulse 96  Temp(Src) 99.1 F (37.3 C) (Tympanic)  Resp 16  Ht 5\' 7"  (1.702 m)  Wt 193 lb (87.544 kg)  BMI 30.22 kg/m2  SpO2 99%  LMP 01/21/2015 (Exact Date) No data found.   Physical Exam  Constitutional: She is oriented to person, place, and time. She appears well-developed and well-nourished. She is active and cooperative.  Non-toxic appearance. She does not have a sickly appearance. She appears ill. No distress.  HENT:  Head: Normocephalic and atraumatic.  Right Ear: Hearing, external ear and ear canal normal. Tympanic membrane is injected and bulging. A middle ear effusion is present.  Left Ear: Hearing, external ear and ear canal normal. Tympanic membrane is injected, erythematous and bulging. A middle ear effusion is present.  Nose: Mucosal edema and rhinorrhea present. No nose lacerations, sinus tenderness, nasal deformity, septal deviation or nasal septal hematoma. No epistaxis.  No foreign bodies. Right sinus exhibits maxillary sinus tenderness. Right sinus exhibits no frontal sinus tenderness. Left sinus exhibits maxillary sinus tenderness. Left sinus exhibits no frontal sinus tenderness.  Mouth/Throat: Uvula is midline and mucous membranes are normal. Mucous membranes are not pale, not dry and not cyanotic. She does not have dentures. No oral lesions. No trismus in the jaw. Normal dentition. No dental abscesses, uvula swelling, lacerations or dental caries. Posterior oropharyngeal edema and posterior oropharyngeal erythema present. No oropharyngeal exudate or tonsillar abscesses.  Bilateral tonsils 3+/4 erythema; cobblestoning posterior pharynx; bilateral nasal turbinates  erythema/edema/clear discharge congested boggy; bilateral TMs bulging with fluid and vasculature inflamed; left TM injected and external canal with erythema  Eyes: Conjunctivae, EOM and lids are normal. Pupils are equal, round, and reactive to light. Right eye exhibits no chemosis, no discharge, no exudate and no hordeolum. No foreign body present in the right eye. Left eye exhibits no chemosis, no discharge, no exudate and no hordeolum. No foreign body present in the left eye. Right conjunctiva is not injected. Right conjunctiva has no hemorrhage. Left conjunctiva is not injected. Left conjunctiva has no hemorrhage. No scleral icterus. Right eye exhibits normal extraocular motion and no nystagmus. Left eye exhibits normal extraocular motion and no nystagmus. Right pupil is round and reactive. Left pupil is round and reactive. Pupils are equal.  Neck: Trachea normal and normal range of motion. Neck supple. No tracheal tenderness, no spinous process tenderness and no muscular tenderness present. No rigidity. No tracheal deviation, no edema, no erythema and normal range of motion present. No thyroid mass and no thyromegaly present.  Cardiovascular: Normal rate, regular rhythm, S1 normal, S2 normal, normal heart sounds and intact distal pulses.  PMI is not displaced.  Exam reveals no gallop and no friction rub.   No murmur heard. Pulmonary/Chest: Effort normal and breath sounds normal. No accessory muscle usage or stridor. No respiratory distress. She has no decreased breath sounds. She has no wheezes. She has no rhonchi. She has no  rales. She exhibits no tenderness.  Abdominal: Soft. She exhibits no distension.  Musculoskeletal: Normal range of motion. She exhibits no edema or tenderness.       Right shoulder: Normal.       Left shoulder: Normal.       Right elbow: Normal.      Left elbow: Normal.       Right hip: Normal.       Left hip: Normal.       Right knee: Normal.       Left knee: Normal.        Cervical back: Normal.       Right hand: Normal.       Left hand: Normal.  Lymphadenopathy:       Head (right side): No submental, no submandibular, no tonsillar, no preauricular, no posterior auricular and no occipital adenopathy present.       Head (left side): No submental, no submandibular, no tonsillar, no preauricular, no posterior auricular and no occipital adenopathy present.    She has no cervical adenopathy.       Right cervical: No superficial cervical, no deep cervical and no posterior cervical adenopathy present.      Left cervical: No superficial cervical, no deep cervical and no posterior cervical adenopathy present.  TTP anterior cervical chain bilaterally  Neurological: She is alert and oriented to person, place, and time. She has normal strength. She is not disoriented. She displays no atrophy and no tremor. No cranial nerve deficit or sensory deficit. She exhibits normal muscle tone. She displays no seizure activity. Coordination and gait normal. GCS eye subscore is 4. GCS verbal subscore is 5. GCS motor subscore is 6.  Skin: Skin is warm, dry and intact. No abrasion, no bruising, no burn, no ecchymosis, no laceration, no lesion, no petechiae and no rash noted. She is not diaphoretic. No cyanosis or erythema. No pallor. Nails show no clubbing.  Psychiatric: She has a normal mood and affect. Her speech is normal and behavior is normal. Judgment and thought content normal. Cognition and memory are normal.  Nursing note and vitals reviewed.   ED Course  Procedures (including critical care time)  Labs Review Labs Reviewed - No data to display  Imaging Review No results found.    MDM   1. Acute suppurative otitis media of left ear without spontaneous rupture of tympanic membrane, recurrence not specified   2. Acute maxillary sinusitis, recurrence not specified   3. Otitis media with effusion, right   4. Acute tonsillitis, unspecified etiology    Supportive treatment.    No evidence of invasive bacterial infection, non toxic and well hydrated.  This is most likely self limiting viral infection.  I do not see where any further testing or imaging is necessary at this time.   I will suggest supportive care, rest, good hygiene and encourage the patient to take adequate fluids.  The patient is to return to clinic or EMERGENCY ROOM if symptoms worsen or change significantly e.g. ear pain, fever, purulent discharge from ears or bleeding.  Exitcare handout on otitis media and otitis media with effusion given to patient.  Patient verbalized agreement and understanding of treatment plan and had no further questions at this time.    Suspect Viral illness: no evidence of invasive bacterial infection, non toxic and well hydrated.  This is most likely self limiting viral infection.  I do not see where any further testing or imaging is necessary at this time.  I will suggest supportive care, rest, good hygiene and encourage the patient to take adequate fluids.  72 hour work excuse given 17-23 Jan 2015  Discussed sudafed use not recommended during pregnancy (trying to conceive currently LMP 20 Jan 2015  Sudafed 30mg  po q4-6h prn; flonase 1 spray each nostril BID prn, nasal saline 1-2 sprays each nostril prn q2h, discussed motrin not recommended first trimester motrin 800mg  po TID prn.  Discussed honey with lemon and salt water gargles for comfort also.  The patient is to return to clinic or EMERGENCY ROOM if symptoms worsen or change significantly e.g. fever, lethargy, SOB, wheezing.  Exitcare handout on tonsillitis given to patient.  Patient verbalized agreement and understanding of treatment plan and had no further questions at this time.    augmentin 875mg  po BID x 10 days, flonase 1 spray each nostril BID, continue nasal saline 2 sprays each nostril q2h prn congestion.  Hold afrin use as used 72 hours over the weekend discussed longer use can result in rebound swelling.  No evidence of  systemic bacterial infection, non toxic and well hydrated.  I do not see where any further testing or imaging is necessary at this time.   I will suggest supportive care, rest, good hygiene and encourage the patient to take adequate fluids.  The patient is to return to clinic or EMERGENCY ROOM if symptoms worsen or change significantly.  Exitcare handout on sinusitis given to patient.  Patient verbalized agreement and understanding of treatment plan and had no further questions at this time.   P2:  Hand washing and cover cough  On augmentin for sinusitis and otitis media.  Usually no specific medical treatment is needed if a virus is causing the sore throat.  The throat most often gets better on its own within 5 to 7 days.  Antibiotic medicine does not cure viral pharyngitis.   For acute pharyngitis caused by bacteria, your healthcare provider will prescribe an antibiotic.  Marland Kitchen Do not smoke.  Marland Kitchen Avoid secondhand smoke and other air pollutants.  . Use a cool mist humidifier to add moisture to the air.  . Get plenty of rest.  . You may want to rest your throat by talking less and eating a diet that is mostly liquid or soft for a day or two.   Marland Kitchen Nonprescription throat lozenges and mouthwashes should help relieve the soreness.   . Gargling with warm saltwater and drinking warm liquids may help.  (You can make a saltwater solution by adding 1/4 teaspoon of salt to 8 ounces, or 240 mL, of warm water.)  . A nonprescription pain reliever such as aspirin, acetaminophen, or ibuprofen may ease general aches and pains.   FOLLOW UP with clinic provider if no improvements in the next 7-10 days.  Patient verbalized understanding of instructions and agreed with plan of care. P2:  Hand washing and diet.    Olen Cordial, NP 01/22/15 [phone]

## 2015-07-14 ENCOUNTER — Observation Stay
Admission: EM | Admit: 2015-07-14 | Discharge: 2015-07-14 | Disposition: A | Discharge status: Home / Self Care | Payer: Commercial Managed Care - PPO | Attending: Obstetrics and Gynecology | Admitting: Obstetrics and Gynecology

## 2015-07-14 ENCOUNTER — Encounter: Payer: Self-pay | Admitting: Emergency Medicine

## 2015-07-14 ENCOUNTER — Emergency Department
Admission: EM | Admit: 2015-07-14 | Discharge: 2015-07-14 | Disposition: A | Discharge status: Home / Self Care | Payer: Commercial Managed Care - PPO | Source: Home / Self Care | Attending: Emergency Medicine | Admitting: Emergency Medicine

## 2015-07-14 ENCOUNTER — Encounter: Payer: Self-pay | Admitting: *Deleted

## 2015-07-14 DIAGNOSIS — O9989 Other specified diseases and conditions complicating pregnancy, childbirth and the puerperium: Secondary | ICD-10-CM | POA: Insufficient documentation

## 2015-07-14 DIAGNOSIS — O99282 Endocrine, nutritional and metabolic diseases complicating pregnancy, second trimester: Secondary | ICD-10-CM | POA: Diagnosis not present

## 2015-07-14 DIAGNOSIS — F329 Major depressive disorder, single episode, unspecified: Secondary | ICD-10-CM

## 2015-07-14 DIAGNOSIS — Z87891 Personal history of nicotine dependence: Secondary | ICD-10-CM | POA: Insufficient documentation

## 2015-07-14 DIAGNOSIS — E876 Hypokalemia: Secondary | ICD-10-CM | POA: Insufficient documentation

## 2015-07-14 DIAGNOSIS — Z79899 Other long term (current) drug therapy: Secondary | ICD-10-CM | POA: Insufficient documentation

## 2015-07-14 DIAGNOSIS — O0902 Supervision of pregnancy with history of infertility, second trimester: Secondary | ICD-10-CM | POA: Diagnosis not present

## 2015-07-14 DIAGNOSIS — Z885 Allergy status to narcotic agent status: Secondary | ICD-10-CM | POA: Diagnosis not present

## 2015-07-14 DIAGNOSIS — M546 Pain in thoracic spine: Secondary | ICD-10-CM

## 2015-07-14 DIAGNOSIS — O09292 Supervision of pregnancy with other poor reproductive or obstetric history, second trimester: Secondary | ICD-10-CM | POA: Diagnosis not present

## 2015-07-14 DIAGNOSIS — M549 Dorsalgia, unspecified: Secondary | ICD-10-CM | POA: Diagnosis not present

## 2015-07-14 DIAGNOSIS — D72829 Elevated white blood cell count, unspecified: Secondary | ICD-10-CM | POA: Insufficient documentation

## 2015-07-14 DIAGNOSIS — Z3A24 24 weeks gestation of pregnancy: Secondary | ICD-10-CM

## 2015-07-14 DIAGNOSIS — O99343 Other mental disorders complicating pregnancy, third trimester: Secondary | ICD-10-CM | POA: Diagnosis not present

## 2015-07-14 DIAGNOSIS — E871 Hypo-osmolality and hyponatremia: Secondary | ICD-10-CM | POA: Insufficient documentation

## 2015-07-14 DIAGNOSIS — Z3490 Encounter for supervision of normal pregnancy, unspecified, unspecified trimester: Secondary | ICD-10-CM

## 2015-07-14 DIAGNOSIS — Z3480 Encounter for supervision of other normal pregnancy, unspecified trimester: Secondary | ICD-10-CM

## 2015-07-14 LAB — URINALYSIS COMPLETE WITH MICROSCOPIC (ARMC ONLY)
Bacteria, UA: NONE SEEN
Bilirubin Urine: NEGATIVE
GLUCOSE, UA: NEGATIVE mg/dL
Hgb urine dipstick: NEGATIVE
KETONES UR: NEGATIVE mg/dL
Leukocytes, UA: NEGATIVE
Nitrite: NEGATIVE
PROTEIN: NEGATIVE mg/dL
RBC / HPF: NONE SEEN RBC/hpf (ref 0–5)
SQUAMOUS EPITHELIAL / LPF: NONE SEEN
Specific Gravity, Urine: 1.001 — ABNORMAL LOW (ref 1.005–1.030)
pH: 6 (ref 5.0–8.0)

## 2015-07-14 LAB — COMPREHENSIVE METABOLIC PANEL
ALBUMIN: 3 g/dL — AB (ref 3.5–5.0)
ALK PHOS: 105 U/L (ref 38–126)
ALT: 12 U/L — AB (ref 14–54)
AST: 16 U/L (ref 15–41)
Anion gap: 8 (ref 5–15)
BUN: 7 mg/dL (ref 6–20)
CO2: 21 mmol/L — ABNORMAL LOW (ref 22–32)
CREATININE: 0.5 mg/dL (ref 0.44–1.00)
Calcium: 8.9 mg/dL (ref 8.9–10.3)
Chloride: 105 mmol/L (ref 101–111)
GFR calc Af Amer: >60 mL/min (ref >60)
GLUCOSE: 78 mg/dL (ref 65–99)
POTASSIUM: 3.4 mmol/L — AB (ref 3.5–5.1)
Sodium: 134 mmol/L — ABNORMAL LOW (ref 135–145)
TOTAL PROTEIN: 7.1 g/dL (ref 6.5–8.1)

## 2015-07-14 LAB — CBC
HEMATOCRIT: 35.4 % (ref 35.0–47.0)
HEMOGLOBIN: 12 g/dL (ref 12.0–16.0)
MCH: 30.1 pg (ref 26.0–34.0)
MCHC: 34 g/dL (ref 32.0–36.0)
MCV: 88.5 fL (ref 80.0–100.0)
Platelets: 282 10*3/uL (ref 150–440)
RBC: 4 MIL/uL (ref 3.80–5.20)
RDW: 13.4 % (ref 11.5–14.5)
WBC: 14.3 10*3/uL — AB (ref 3.6–11.0)

## 2015-07-14 NOTE — ED Notes (Addendum)
EDP did ultrasound at bedside.  Per pt she has history of back labor and was concerned about the pain because it was similar to her first pregnancy labor.

## 2015-07-14 NOTE — OB Triage Note (Signed)
G3P1 presents at [redacted]w[redacted]d gestation w/ c/o back pain 2/10. Was at work earlier when she felt a sudden sharp pain that she rated 28/10. Improved with rest and fluids. ER worked up patient, including CBC, U/S, and pelvic exam. Upon exam, pt cervix was closed. No bleeding, LOF. Positive FM.

## 2015-07-14 NOTE — Discharge Instructions (Signed)
Come back:  If you feel a big gush of fluids Fever over 100.4 Heavy vaginal bleeding Decreased fetal Movement Contractions every 3-5 minutes that last for at least one hour  Call OB with any questions or concerns   Get plenty of rest and stay well hydrated!

## 2015-07-14 NOTE — OB Triage Provider Note (Signed)
TRIAGE NOTE to rule out Preterm Labor   History of Present Illness: Sara Gonzales is a 35 y.o. G3P1011 at [redacted]w[redacted]d presenting to triage after ER with sharp back pain that has resolved. She is assessed for fetal evaluation and rule out preterm labor.  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 It localizes to the mid-back and does not radiate to flank or lower pelvis.  Normal fetal movement, no LOF, vaginal discharge.   Labs are unremarkable for UTI, liver dysfunction. WBC mildly elevated, but afebrile.    Patient Active Problem List   Diagnosis Date Noted  . Pregnancy 07/14/2015  . Encounter for supervision of other normal pregnancy 07/14/2015  . Routine general medical examination at a health care facility 06/07/2012  . Routine gynecological examination 06/07/2012    Past Medical History  Diagnosis Date  . Irregular menses     chronic  . Spontaneous miscarriage   . Female infertility   . Abnormal blood chemistry   . Knee pain, bilateral   . AR (allergic rhinitis)   . Viral warts   . Neoplasm     benign skin  . Goiter   . Tobacco use disorder   . Depression   . Allergy     Past Surgical History  Procedure Laterality Date  . Tubes in ear      OB History  Gravida Para Term Preterm AB SAB TAB Ectopic Multiple Living  3 1 1  1 1    1     # Outcome Date GA Lbr Len/2nd Weight Sex Delivery Anes PTL Lv  3 Current           2 Term 12/27/11    F Stormy Card Y  1 SAB 04/04/09     SAB         Social History   Social History  . Marital Status: Single    Spouse Name: N/A  . Number of Children: 1  . Years of Education: 14   Occupational History  . medical assistant    Social History Main Topics  . Smoking status: Former Smoker -- 1.00 packs/day for 14 years  . Smokeless tobacco: Never Used  . Alcohol Use: Yes     Comment: socially  . Drug Use: No  . Sexual Activity: Yes    Birth Control/ Protection: None    Other Topics Concern  . None   Social History Narrative   Marital status: married.   Dating x 1 year.  No domestic abuse; happy.      Children: one daughter [name] 32 months)      Lives: husband, daughter, step daughter Jillyn Ledger)      Employment: works with Dentist; MA. Full time.      Tobacco: 1 ppd; down to 1/2 ppd in 2016; smoking x 14 years.      Alcohol: socially; no DWIs.      Drugs: none      Exercise: not currently      Seatlbelt: 100%;       Sunscreen: rare sun exposure; no tanning.      Sexual activity: two total lifetime partner; no STDs.  No contraception.    Family History  Problem Relation Age of Onset  . Hypotension Mother     also hypoglycemia  . Migraines Mother   . Diabetes Father   . Kidney disease Father   . Diabetes Brother  type I  . Mental illness Brother     mental disorder; mother with custody.  . Kidney failure Brother     Allergies  Allergen Reactions  . Codeine Rash    Prescriptions prior to admission  Medication Sig Dispense Refill Last Dose  . Prenatal Vit-Fe Fumarate-FA (PRENATAL MULTIVITAMIN) TABS tablet Take 1 tablet by mouth daily at 12 noon.   07/14/2015 at 1200    Review of Systems - See HPI for OB specific ROS.   Vitals:  LMP 01/21/2015 (Exact Date) Physical Examination: CONSTITUTIONAL: Well-developed, well-nourished female in no acute distress.   SKIN: Skin is warm and dry. No rash noted. Not diaphoretic. No erythema. No pallor. [place]: Alert and oriented to person, place, and time. No gross cranial nerve deficit noted. PSYCHIATRIC: Normal mood and affect. Normal behavior. Normal judgment and thought content. ABDOMEN: Soft, nontender, nondistended, gravid. ACK: No CVA tenderness  Cervix: closed/thick and long Membranes:intact Fetal Monitoring:135, mod var, +10x10 accels, no decels- appropriate for gestational age 40: Flat  Labs:  Results for orders placed or performed during the hospital encounter of  07/14/15 (from the past 24 hour(s))  Urinalysis complete, with microscopic [facility] only)   Collection Time: 07/14/15  7:08 PM  Result Value Ref Range   Color, Urine COLORLESS (A) YELLOW   APPearance CLEAR (A) CLEAR   Glucose, UA NEGATIVE NEGATIVE mg/dL   Bilirubin Urine NEGATIVE NEGATIVE   Ketones, ur NEGATIVE NEGATIVE mg/dL   Specific Gravity, Urine 1.001 (L) 1.005 - 1.030   Hgb urine dipstick NEGATIVE NEGATIVE   pH 6.0 5.0 - 8.0   Protein, ur NEGATIVE NEGATIVE mg/dL   Nitrite NEGATIVE NEGATIVE   Leukocytes, UA NEGATIVE NEGATIVE   RBC / HPF NONE SEEN 0 - 5 RBC/hpf   WBC, UA 0-5 0 - 5 WBC/hpf   Bacteria, UA NONE SEEN NONE SEEN   Squamous Epithelial / LPF NONE SEEN NONE SEEN  CBC   Collection Time: 07/14/15  7:59 PM  Result Value Ref Range   WBC 14.3 (H) 3.6 - 11.0 K/uL   RBC 4.00 3.80 - 5.20 MIL/uL   Hemoglobin 12.0 12.0 - 16.0 g/dL   HCT 35.4 35.0 - 47.0 %   MCV 88.5 80.0 - 100.0 fL   MCH 30.1 26.0 - 34.0 pg   MCHC 34.0 32.0 - 36.0 g/dL   RDW 13.4 11.5 - 14.5 %   Platelets 282 150 - 440 K/uL  Comprehensive metabolic panel   Collection Time: 07/14/15  7:59 PM  Result Value Ref Range   Sodium 134 (L) 135 - 145 mmol/L   Potassium 3.4 (L) 3.5 - 5.1 mmol/L   Chloride 105 101 - 111 mmol/L   CO2 21 (L) 22 - 32 mmol/L   Glucose, Bld 78 65 - 99 mg/dL   BUN 7 6 - 20 mg/dL   Creatinine, Ser 0.50 0.44 - 1.00 mg/dL   Calcium 8.9 8.9 - 10.3 mg/dL   Total Protein 7.1 6.5 - 8.1 g/dL   Albumin 3.0 (L) 3.5 - 5.0 g/dL   AST 16 15 - 41 U/L   ALT 12 (L) 14 - 54 U/L   Alkaline Phosphatase 105 38 - 126 U/L   Total Bilirubin <0.1 (L) 0.3 - 1.2 mg/dL   GFR calc non Af Amer >60 >60 mL/min   GFR calc Af Amer >60 >60 mL/min   Anion gap 8 5 - 15    Imaging Studies: No results found. Bedside u/s in the ER; FHT doppler in L&D  triage  Assessment and Plan: Patient Active Problem List   Diagnosis Date Noted  . Pregnancy 07/14/2015  . Encounter for supervision of other normal pregnancy  07/14/2015  . Routine general medical examination at a health care facility 06/07/2012  . Routine gynecological examination 06/07/2012    1. Back pain: Likely MSK spasm. Does not appear to be related to infection or preterm labor. Rest, hydration and pain has resolved from 8/10 to 2/10. TO call office for flexeril if desired tomorrow, but declines tonight. 2. Fetal testing: wnl  Angelina Pih, MD, MPH

## 2015-07-14 NOTE — Discharge Instructions (Signed)

## 2015-07-14 NOTE — ED Notes (Signed)
R mid back pain began approx 3 pm today, was sudden onset, feels like stabbing pain. Denies SOB. Denies contraction. Denies passing blood or fluid. Denies injury.

## 2015-07-14 NOTE — ED Provider Notes (Signed)
[facility] Emergency Department Provider Note  Time seen: 7:40 PM  I have reviewed the triage vital signs and the nursing notes.   HISTORY  Chief Complaint Back Pain    HPI Sara Gonzales is a 35 y.o. female with a past medical history of depression, G2 P1 approximately [redacted] weeks pregnant who presents to the emergency department with back pain. According to the patient with her first pregnancy she was having back pain as her only symptom of labor. States the back pain today feels fairly identical to that which concerned her so she came to the emergency department for evaluation. Patient denies any lower abdominal pain, vaginal bleeding, or fluid leakage. States the back pain is mostly on the right side mid back, feels better when you push on the area or massage the area. Patient does states she was on her feet more than normal over the past 2 days, but denies any other strenuous activity. Denies dysuria. Denies nausea, vomiting, diarrhea. Denies fever. Denies trouble breathing or chest pain.     Past Medical History  Diagnosis Date  . Irregular menses     chronic  . Spontaneous miscarriage   . Female infertility   . Abnormal blood chemistry   . Knee pain, bilateral   . AR (allergic rhinitis)   . Viral warts   . Neoplasm     benign skin  . Goiter   . Tobacco use disorder   . Depression   . Allergy     Patient Active Problem List   Diagnosis Date Noted  . Routine general medical examination at a health care facility 06/07/2012  . Routine gynecological examination 06/07/2012    Past Surgical History  Procedure Laterality Date  . Tubes in ear      Current Outpatient Rx  Name  Route  Sig  Dispense  Refill  . Prenatal Vit-Fe Fumarate-FA (PRENATAL MULTIVITAMIN) TABS tablet   Oral   Take 1 tablet by mouth daily at 12 noon.           Allergies Codeine  Family History  Problem Relation Age of Onset  . Hypotension Mother     also  hypoglycemia  . Migraines Mother   . Diabetes Father   . Kidney disease Father   . Diabetes Brother     type I  . Mental illness Brother     mental disorder; mother with custody.  . Kidney failure Brother     Social History Social History  Substance Use Topics  . Smoking status: Former Smoker -- 1.00 packs/day for 14 years  . Smokeless tobacco: Never Used  . Alcohol Use: Yes     Comment: socially    Review of Systems Constitutional: Negative for fever. Cardiovascular: Negative for chest pain. Respiratory: Negative for shortness of breath. Gastrointestinal: Negative for abdominal pain, vomiting and diarrhea. Genitourinary: Negative for dysuria. Musculoskeletal: Positive for right mid back pain. Neurological: Negative for headache 10-point ROS otherwise negative.  ____________________________________________   PHYSICAL EXAM:  VITAL SIGNS: ED Triage Vitals  Enc Vitals Group     BP 07/14/15 1843 131/74 mmHg     Pulse Rate 07/14/15 1843 100     Resp 07/14/15 1843 20     Temp 07/14/15 1843 98 F (36.7 C)     Temp Source 07/14/15 1843 Oral     SpO2 07/14/15 1843 100 %     Weight 07/14/15 1843 221 lb (100.245 kg)     Height 07/14/15 1843 5'  6" (1.676 m)     Head Cir --      Peak Flow --      Pain Score 07/14/15 1844 8     Pain Loc --      Pain Edu? --      Excl. in [place]? --    Constitutional: Alert and oriented. Well appearing and in no distress. Eyes: Normal exam ENT   Head: Normocephalic and atraumatic.   Mouth/Throat: Mucous membranes are moist. Cardiovascular: Normal rate, regular rhythm. No murmur Respiratory: Normal respiratory effort without tachypnea nor retractions. Breath sounds are clear  Gastrointestinal: Soft and nontender. No distention.   Musculoskeletal: No back tenderness to palpation. No lower extremity tenderness. Neurologic:  Normal speech and language. No gross focal neurologic deficits Skin:  Skin is warm, dry and intact.   Psychiatric: Mood and affect are normal.   ____________________________________________    INITIAL IMPRESSION / ASSESSMENT AND PLAN / ED COURSE  Pertinent labs & imaging results that were available during my care of the patient were reviewed by me and considered in my medical decision making (see chart for details).  The patient presents to the emergency department with right mid back pain. States the pain is relieved when she pushes or massages on the area. Denies any abdominal pain. Patient has a fairly normal physical exam, no abdominal tenderness. Bedside ultrasound shows a fetal heart rate of 148 bpm, good fetal movement. We will check the patient's cervix, obtain labs and sent to labor and delivery for further monitoring.  Labs are largely within normal limits. Cervical check shows a fingertip external cervix with a closed internal os. Back pain is likely muscular skeletal origin. Patient would like to be evaluated by labor and delivery. I have discussed the patient with labor and delivery and we'll be sending the patient for further evaluation.  ____________________________________________   FINAL CLINICAL IMPRESSION(S) / ED DIAGNOSES  Back pain   Harvest Dark, MD 07/14/15 2106

## 2015-07-14 NOTE — Final Progress Note (Signed)
TRIAGE NOTE to rule out Preterm Labor   History of Present Illness: Sara Gonzales is a 35 y.o. G3P1011 at [redacted]w[redacted]d presenting to triage after ER with sharp back pain that has resolved. She is assessed for fetal evaluation and rule out preterm labor.  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 It localizes to the mid-back and does not radiate to flank or lower pelvis.  Normal fetal movement, no LOF, vaginal discharge.   Labs are unremarkable for UTI, liver dysfunction. WBC mildly elevated, but afebrile. Well hydrated.   Patient Active Problem List   Diagnosis Date Noted  . Pregnancy 07/14/2015  . Encounter for supervision of other normal pregnancy 07/14/2015  . Routine general medical examination at a health care facility 06/07/2012  . Routine gynecological examination 06/07/2012    Past Medical History  Diagnosis Date  . Irregular menses     chronic  . Spontaneous miscarriage   . Female infertility   . Abnormal blood chemistry   . Knee pain, bilateral   . AR (allergic rhinitis)   . Viral warts   . Neoplasm     benign skin  . Goiter   . Tobacco use disorder   . Depression   . Allergy     Past Surgical History  Procedure Laterality Date  . Tubes in ear      OB History  Gravida Para Term Preterm AB SAB TAB Ectopic Multiple Living  3 1 1  1 1    1     # Outcome Date GA Lbr Len/2nd Weight Sex Delivery Anes PTL Lv  3 Current           2 Term 12/27/11    F Stormy Card Y  1 SAB 04/04/09     SAB         Social History   Social History  . Marital Status: Single    Spouse Name: N/A  . Number of Children: 1  . Years of Education: 14   Occupational History  . medical assistant    Social History Main Topics  . Smoking status: Former Smoker -- 1.00 packs/day for 14 years  . Smokeless tobacco: Never Used  . Alcohol Use: Yes     Comment: socially  . Drug Use: No  . Sexual Activity: Yes    Birth Control/  Protection: None   Other Topics Concern  . None   Social History Narrative   Marital status: married.   Dating x 1 year.  No domestic abuse; happy.      Children: one daughter [name] 32 months)      Lives: husband, daughter, step daughter Jillyn Ledger)      Employment: works with [employer]; MA. Full time.      Tobacco: 1 ppd; down to 1/2 ppd in 2016; smoking x 14 years.      Alcohol: socially; no DWIs.      Drugs: none      Exercise: not currently      Seatlbelt: 100%;       Sunscreen: rare sun exposure; no tanning.      Sexual activity: two total lifetime partner; no STDs.  No contraception.    Family History  Problem Relation Age of Onset  . Hypotension Mother     also hypoglycemia  . Migraines Mother   . Diabetes Father   . Kidney disease Father   . Diabetes Brother  type I  . Mental illness Brother     mental disorder; mother with custody.  . Kidney failure Brother     Allergies  Allergen Reactions  . Codeine Rash    Prescriptions prior to admission  Medication Sig Dispense Refill Last Dose  . Prenatal Vit-Fe Fumarate-FA (PRENATAL MULTIVITAMIN) TABS tablet Take 1 tablet by mouth daily at 12 noon.   07/14/2015 at 1200    Review of Systems - See HPI for OB specific ROS.   Vitals:  LMP 01/21/2015 (Exact Date) Physical Examination: CONSTITUTIONAL: Well-developed, well-nourished female in no acute distress.   SKIN: Skin is warm and dry. No rash noted. Not diaphoretic. No erythema. No pallor. [place]: Alert and oriented to person, place, and time. No gross cranial nerve deficit noted. PSYCHIATRIC: Normal mood and affect. Normal behavior. Normal judgment and thought content. ABDOMEN: Soft, nontender, nondistended, gravid. ACK: No CVA tenderness  Cervix: closed/thick/long (external os open 1.5cm) Membranes:intact Fetal Monitoring:135, mod var, +10x10 accels, no decels- appropriate for gestational age 32: Flat  Labs:  Results for orders placed or performed  during the hospital encounter of 07/14/15 (from the past 24 hour(s))  Urinalysis complete, with microscopic [facility] only)   Collection Time: 07/14/15  7:08 PM  Result Value Ref Range   Color, Urine COLORLESS (A) YELLOW   APPearance CLEAR (A) CLEAR   Glucose, UA NEGATIVE NEGATIVE mg/dL   Bilirubin Urine NEGATIVE NEGATIVE   Ketones, ur NEGATIVE NEGATIVE mg/dL   Specific Gravity, Urine 1.001 (L) 1.005 - 1.030   Hgb urine dipstick NEGATIVE NEGATIVE   pH 6.0 5.0 - 8.0   Protein, ur NEGATIVE NEGATIVE mg/dL   Nitrite NEGATIVE NEGATIVE   Leukocytes, UA NEGATIVE NEGATIVE   RBC / HPF NONE SEEN 0 - 5 RBC/hpf   WBC, UA 0-5 0 - 5 WBC/hpf   Bacteria, UA NONE SEEN NONE SEEN   Squamous Epithelial / LPF NONE SEEN NONE SEEN  CBC   Collection Time: 07/14/15  7:59 PM  Result Value Ref Range   WBC 14.3 (H) 3.6 - 11.0 K/uL   RBC 4.00 3.80 - 5.20 MIL/uL   Hemoglobin 12.0 12.0 - 16.0 g/dL   HCT 35.4 35.0 - 47.0 %   MCV 88.5 80.0 - 100.0 fL   MCH 30.1 26.0 - 34.0 pg   MCHC 34.0 32.0 - 36.0 g/dL   RDW 13.4 11.5 - 14.5 %   Platelets 282 150 - 440 K/uL  Comprehensive metabolic panel   Collection Time: 07/14/15  7:59 PM  Result Value Ref Range   Sodium 134 (L) 135 - 145 mmol/L   Potassium 3.4 (L) 3.5 - 5.1 mmol/L   Chloride 105 101 - 111 mmol/L   CO2 21 (L) 22 - 32 mmol/L   Glucose, Bld 78 65 - 99 mg/dL   BUN 7 6 - 20 mg/dL   Creatinine, Ser 0.50 0.44 - 1.00 mg/dL   Calcium 8.9 8.9 - 10.3 mg/dL   Total Protein 7.1 6.5 - 8.1 g/dL   Albumin 3.0 (L) 3.5 - 5.0 g/dL   AST 16 15 - 41 U/L   ALT 12 (L) 14 - 54 U/L   Alkaline Phosphatase 105 38 - 126 U/L   Total Bilirubin <0.1 (L) 0.3 - 1.2 mg/dL   GFR calc non Af Amer >60 >60 mL/min   GFR calc Af Amer >60 >60 mL/min   Anion gap 8 5 - 15    Imaging Studies: No results found. Bedside u/s in the ER; FHT doppler  in L&D triage  Assessment and Plan: Patient Active Problem List   Diagnosis Date Noted  . Pregnancy 07/14/2015  . Encounter for  supervision of other normal pregnancy 07/14/2015  . Routine general medical examination at a health care facility 06/07/2012  . Routine gynecological examination 06/07/2012    1. Back pain: Likely MSK spasm. Does not appear to be related to infection or preterm labor. Rest, hydration and pain has resolved from 8/10 to 2/10. TO call office for flexeril if desired tomorrow, but declines tonight. 2. Fetal testing: wnl 3. Hypokalemia:  likely clinically insignificant and due to altered po intact. No evidence of cardiac or renal defect. 4. Hyponatremia: likely clinically insignificant and due to altered po intact. No evidence of cardiac or renal defect. 5. Leukocytosis: Likely secondary to pain with no other s/s infection and afebrile, but recommend repeat in 2 weeks at next clinic visit. 6. D/c home with preterm labor, UTI precautions. Call to make office appointment if needed prior to next 2 weeks.  Angelina Pih, MD, MPH

## 2015-07-15 NOTE — Discharge Summary (Signed)
Pt d/c'd to home in stable condition. No more c/o contractions. Cervical exam was unchanged from earlier cervical exam. Instructed to contact provider if contractions continue or there are any other signs of PTL.

## 2015-10-22 ENCOUNTER — Inpatient Hospital Stay
Admission: EM | Admit: 2015-10-22 | Discharge: 2015-10-25 | DRG: 768 | Disposition: A | Discharge status: Home / Self Care | Payer: Commercial Managed Care - PPO | Attending: Obstetrics and Gynecology | Admitting: Obstetrics and Gynecology

## 2015-10-22 ENCOUNTER — Inpatient Hospital Stay: Payer: Commercial Managed Care - PPO

## 2015-10-22 DIAGNOSIS — R0600 Dyspnea, unspecified: Secondary | ICD-10-CM

## 2015-10-22 DIAGNOSIS — M419 Scoliosis, unspecified: Secondary | ICD-10-CM | POA: Diagnosis present

## 2015-10-22 DIAGNOSIS — O9081 Anemia of the puerperium: Secondary | ICD-10-CM | POA: Diagnosis not present

## 2015-10-22 DIAGNOSIS — E669 Obesity, unspecified: Secondary | ICD-10-CM | POA: Diagnosis present

## 2015-10-22 DIAGNOSIS — O43123 Velamentous insertion of umbilical cord, third trimester: Secondary | ICD-10-CM | POA: Diagnosis present

## 2015-10-22 DIAGNOSIS — D649 Anemia, unspecified: Secondary | ICD-10-CM | POA: Diagnosis not present

## 2015-10-22 DIAGNOSIS — R079 Chest pain, unspecified: Secondary | ICD-10-CM

## 2015-10-22 DIAGNOSIS — Z3A39 39 weeks gestation of pregnancy: Secondary | ICD-10-CM

## 2015-10-22 DIAGNOSIS — Z87891 Personal history of nicotine dependence: Secondary | ICD-10-CM

## 2015-10-22 DIAGNOSIS — O9942 Diseases of the circulatory system complicating childbirth: Principal | ICD-10-CM | POA: Diagnosis present

## 2015-10-22 DIAGNOSIS — Z833 Family history of diabetes mellitus: Secondary | ICD-10-CM

## 2015-10-22 DIAGNOSIS — Z6837 Body mass index (BMI) 37.0-37.9, adult: Secondary | ICD-10-CM

## 2015-10-22 DIAGNOSIS — O1203 Gestational edema, third trimester: Secondary | ICD-10-CM

## 2015-10-22 DIAGNOSIS — Z3493 Encounter for supervision of normal pregnancy, unspecified, third trimester: Secondary | ICD-10-CM

## 2015-10-22 DIAGNOSIS — Z3490 Encounter for supervision of normal pregnancy, unspecified, unspecified trimester: Secondary | ICD-10-CM

## 2015-10-22 DIAGNOSIS — O99214 Obesity complicating childbirth: Secondary | ICD-10-CM | POA: Diagnosis present

## 2015-10-22 LAB — CBC
HEMATOCRIT: 37.5 % (ref 35.0–47.0)
HEMOGLOBIN: 12.3 g/dL (ref 12.0–16.0)
MCH: 27.3 pg (ref 26.0–34.0)
MCHC: 32.9 g/dL (ref 32.0–36.0)
MCV: 82.8 fL (ref 80.0–100.0)
Platelets: 238 10*3/uL (ref 150–440)
RBC: 4.53 MIL/uL (ref 3.80–5.20)
RDW: 18.7 % — ABNORMAL HIGH (ref 11.5–14.5)
WBC: 14.8 10*3/uL — AB (ref 3.6–11.0)

## 2015-10-22 LAB — URINALYSIS COMPLETE WITH MICROSCOPIC (ARMC ONLY)
Bilirubin Urine: NEGATIVE
Glucose, UA: 150 mg/dL — AB
HGB URINE DIPSTICK: NEGATIVE
LEUKOCYTES UA: NEGATIVE
Nitrite: NEGATIVE
PROTEIN: NEGATIVE mg/dL
SPECIFIC GRAVITY, URINE: 1.017 (ref 1.005–1.030)
pH: 6 (ref 5.0–8.0)

## 2015-10-22 LAB — COMPREHENSIVE METABOLIC PANEL
ALBUMIN: 2.9 g/dL — AB (ref 3.5–5.0)
ALK PHOS: 154 U/L — AB (ref 38–126)
ALT: 15 U/L (ref 14–54)
ANION GAP: 12 (ref 5–15)
AST: 22 U/L (ref 15–41)
BILIRUBIN TOTAL: 0.2 mg/dL — AB (ref 0.3–1.2)
BUN: 7 mg/dL (ref 6–20)
CALCIUM: 8.9 mg/dL (ref 8.9–10.3)
CO2: 17 mmol/L — AB (ref 22–32)
CREATININE: 0.55 mg/dL (ref 0.44–1.00)
Chloride: 108 mmol/L (ref 101–111)
GFR calc non Af Amer: >60 mL/min (ref >60)
GLUCOSE: 116 mg/dL — AB (ref 65–99)
Potassium: 3.2 mmol/L — ABNORMAL LOW (ref 3.5–5.1)
SODIUM: 137 mmol/L (ref 135–145)
TOTAL PROTEIN: 6.9 g/dL (ref 6.5–8.1)

## 2015-10-22 LAB — FIBRIN DERIVATIVES D-DIMER (ARMC ONLY): Fibrin derivatives D-dimer (ARMC): 2534 — ABNORMAL HIGH (ref 0–499)

## 2015-10-22 LAB — AMYLASE: Amylase: 32 U/L (ref 28–100)

## 2015-10-22 LAB — LIPASE, BLOOD: Lipase: 21 U/L (ref 11–51)

## 2015-10-22 LAB — TROPONIN I

## 2015-10-22 LAB — BRAIN NATRIURETIC PEPTIDE: B Natriuretic Peptide: 156 pg/mL — ABNORMAL HIGH (ref 0.0–100.0)

## 2015-10-22 MED ORDER — SODIUM CHLORIDE FLUSH 0.9 % IV SOLN
INTRAVENOUS | Status: AC
  Administered 2015-10-23: 10 mL
  Filled 2015-10-22: qty 10

## 2015-10-22 MED ORDER — IOPAMIDOL (ISOVUE-370) INJECTION 76%
75.0000 mL | Freq: Once | INTRAVENOUS | Status: AC | PRN
  Administered 2015-10-22: 75 mL via INTRAVENOUS

## 2015-10-22 MED ORDER — FENTANYL CITRATE (PF) 100 MCG/2ML IJ SOLN
50.0000 ug | Freq: Once | INTRAMUSCULAR | Status: AC
  Administered 2015-10-22: 50 ug via INTRAVENOUS
  Filled 2015-10-22: qty 2

## 2015-10-22 MED ORDER — LACTATED RINGERS IV SOLN
INTRAVENOUS | Status: DC
  Administered 2015-10-22: via INTRAVENOUS
  Administered 2015-10-23: 100 mL/h via INTRAVENOUS

## 2015-10-22 NOTE — Progress Notes (Signed)
Phlebotomy at bedside to draw labs

## 2015-10-22 NOTE — Progress Notes (Signed)
Triage visit   Sara Gonzales is a 35 y.o. G3P1011. She is at 108w1d gestation. She presents for acute onset left-sided sharp chest pain that radiates to left upper back, under scapula and then radiates down her back. She reports having "chest heaviness" earlier today, and really noticed a change around 3pm.  She was seen in the office today at 3:30 and it was not mentioned.   This evening around 1830, she came to [facility] ED, and had an EKG showing incomplete right BBB, but was cleared by ED physicians as there was minimal change from previous EKG.  She states it is hard to get comfortable and appears to be agitated.  She has normal mentation.  She reports the pain feels better when she apply pressure to her left chest, but the pain is not better with back massage.  She states she has been nauseous all day, but denies vomiting.  She also reports dyspnea when attempting to take in a deep breath, so she has been taking shallow, short breaths.  She denies heart palpitations.  She is feeling her baby move and states she has been feeling ctxs since Saturday and an increase in pelvic pressure.  In the office, she was 1.5cm/60%/-3/vtx.    O:  BP 134/71   Pulse 99   Temp 98.4 F (36.9 C) (Oral)   Resp (!) 46   Ht 5\' 7"  (1.702 m)   Wt 107.5 kg (237 lb)   LMP 01/21/2015 (Exact Date)   SpO2 99%   BMI 37.12 kg/m  Results for orders placed or performed during the hospital encounter of 10/22/15 (from the past 48 hour(s))  Fibrin derivatives D-Dimer ([facility] only)   Collection Time: 10/22/15  8:46 PM  Result Value Ref Range   Fibrin derivatives D-dimer ([facility]) 2,534 (H) 0 - 499  CBC   Collection Time: 10/22/15  8:46 PM  Result Value Ref Range   WBC 14.8 (H) 3.6 - 11.0 K/uL   RBC 4.53 3.80 - 5.20 MIL/uL   Hemoglobin 12.3 12.0 - 16.0 g/dL   HCT 37.5 35.0 - 47.0 %   MCV 82.8 80.0 - 100.0 fL   MCH 27.3 26.0 - 34.0 pg   MCHC 32.9 32.0 - 36.0 g/dL   RDW 18.7 (H) 11.5 - 14.5 %   Platelets 238 150 - 440 K/uL   Urinalysis complete, with microscopic ([facility] only)   Collection Time: 10/22/15  8:46 PM  Result Value Ref Range   Color, Urine YELLOW (A) YELLOW   APPearance CLEAR (A) CLEAR   Glucose, UA 150 (A) NEGATIVE mg/dL   Bilirubin Urine NEGATIVE NEGATIVE   Ketones, ur TRACE (A) NEGATIVE mg/dL   Specific Gravity, Urine 1.017 1.005 - 1.030   Hgb urine dipstick NEGATIVE NEGATIVE   pH 6.0 5.0 - 8.0   Protein, ur NEGATIVE NEGATIVE mg/dL   Nitrite NEGATIVE NEGATIVE   Leukocytes, UA NEGATIVE NEGATIVE   RBC / HPF 0-5 0 - 5 RBC/hpf   WBC, UA 0-5 0 - 5 WBC/hpf   Bacteria, UA RARE (A) NONE SEEN   Squamous Epithelial / LPF 6-30 (A) NONE SEEN   Mucous PRESENT    Hyaline Casts, UA PRESENT    Ca Oxalate Crys, UA PRESENT   Troponin I (q 6hr x 3)   Collection Time: 10/22/15  8:46 PM  Result Value Ref Range   Troponin I <0.03 <0.03 ng/mL  Amylase   Collection Time: 10/22/15  8:46 PM  Result Value Ref Range  Amylase 32 28 - 100 U/L  Lipase, blood   Collection Time: 10/22/15  8:46 PM  Result Value Ref Range   Lipase 21 11 - 51 U/L  Comprehensive metabolic panel   Collection Time: 10/22/15  8:46 PM  Result Value Ref Range   Sodium 137 135 - 145 mmol/L   Potassium 3.2 (L) 3.5 - 5.1 mmol/L   Chloride 108 101 - 111 mmol/L   CO2 17 (L) 22 - 32 mmol/L   Glucose, Bld 116 (H) 65 - 99 mg/dL   BUN 7 6 - 20 mg/dL   Creatinine, Ser 0.55 0.44 - 1.00 mg/dL   Calcium 8.9 8.9 - 10.3 mg/dL   Total Protein 6.9 6.5 - 8.1 g/dL   Albumin 2.9 (L) 3.5 - 5.0 g/dL   AST 22 15 - 41 U/L   ALT 15 14 - 54 U/L   Alkaline Phosphatase 154 (H) 38 - 126 U/L   Total Bilirubin 0.2 (L) 0.3 - 1.2 mg/dL   GFR calc non Af Amer >60 >60 mL/min   GFR calc Af Amer >60 >60 mL/min   Anion gap 12 5 - 15     Gen: AAOx3, anxious appearing, appears in distress Lungs: clear, equal bilaterally without diminished breath sounds, tachypenic RR are 19-40 breaths per minute  Heart: sinus tachy at times, S1 and S2 noted  No CVAT tenderness  bilaterally       Abd: FNTTP      Ext: Non-tender, +2 pitting BLE edema noted, no erythema, no warmth, no evidence of DVT   O2: 98-100% BP: 130s/80s   FHT: 130 bpm/ moderate variability/ +accels/ no decels  TOCO: quiet SVE: Dilation: 2 Effacement (%): 60 Cervical Position: Anterior Station: -2 Presentation: Vertex Exam by:: [place] RN   A/P:  35 y.o. G3P1011 [redacted]w[redacted]d with acute onset left sided chest pain, nausea, dyspnea, tachypnea    Discussed with Dr. Leafy Ro and we are ruling out PE - chest x-ray, D-dimer, troponin x3, UA, CBC, CMP, amylase, lipase, BNP ordered  Continuous cardiac monitoring, continuous pulse oximetry, continuous fetal monitoring   Insert PIV, LR at 135mL/hr   Will proceed to CT Angiogram if Chest x-ray negative   Dr. Leafy Ro on her way to assess and evaluate, she will consult [facility]   Lars Pinks, CNM

## 2015-10-22 NOTE — ED Notes (Signed)
Pregnant note written in error

## 2015-10-22 NOTE — OB Triage Note (Signed)
Patient arrived to Obs from ED after being evaluated for chest pain. Patient states she has had pain "on and off all day".  EKG normal per ED report.  Patient states baby is moving, some leaking of fluid, uncertain of contractions, no vaginal bleeding. Patient was seen in office by Ailene Ravel today.  EFM applied. Discussed plan of care. Patient verbalized understanding.

## 2015-10-23 ENCOUNTER — Inpatient Hospital Stay: Payer: Commercial Managed Care - PPO | Admitting: Registered Nurse

## 2015-10-23 ENCOUNTER — Observation Stay
Admit: 2015-10-23 | Discharge: 2015-10-23 | Disposition: A | Discharge status: Home / Self Care | Payer: Commercial Managed Care - PPO | Attending: Obstetrics and Gynecology | Admitting: Obstetrics and Gynecology

## 2015-10-23 DIAGNOSIS — Z6837 Body mass index (BMI) 37.0-37.9, adult: Secondary | ICD-10-CM | POA: Diagnosis not present

## 2015-10-23 DIAGNOSIS — O9081 Anemia of the puerperium: Secondary | ICD-10-CM | POA: Diagnosis not present

## 2015-10-23 DIAGNOSIS — R079 Chest pain, unspecified: Secondary | ICD-10-CM | POA: Diagnosis present

## 2015-10-23 DIAGNOSIS — D649 Anemia, unspecified: Secondary | ICD-10-CM | POA: Diagnosis not present

## 2015-10-23 DIAGNOSIS — O9942 Diseases of the circulatory system complicating childbirth: Secondary | ICD-10-CM | POA: Diagnosis present

## 2015-10-23 DIAGNOSIS — Z833 Family history of diabetes mellitus: Secondary | ICD-10-CM | POA: Diagnosis not present

## 2015-10-23 DIAGNOSIS — O43123 Velamentous insertion of umbilical cord, third trimester: Secondary | ICD-10-CM | POA: Diagnosis present

## 2015-10-23 DIAGNOSIS — Z3A39 39 weeks gestation of pregnancy: Secondary | ICD-10-CM | POA: Diagnosis not present

## 2015-10-23 DIAGNOSIS — Z3493 Encounter for supervision of normal pregnancy, unspecified, third trimester: Secondary | ICD-10-CM

## 2015-10-23 DIAGNOSIS — O99214 Obesity complicating childbirth: Secondary | ICD-10-CM | POA: Diagnosis present

## 2015-10-23 DIAGNOSIS — E669 Obesity, unspecified: Secondary | ICD-10-CM | POA: Diagnosis present

## 2015-10-23 DIAGNOSIS — Z87891 Personal history of nicotine dependence: Secondary | ICD-10-CM | POA: Diagnosis not present

## 2015-10-23 DIAGNOSIS — M419 Scoliosis, unspecified: Secondary | ICD-10-CM | POA: Diagnosis present

## 2015-10-23 LAB — COMPREHENSIVE METABOLIC PANEL
ALBUMIN: 2.8 g/dL — AB (ref 3.5–5.0)
ALT: 16 U/L (ref 14–54)
AST: 24 U/L (ref 15–41)
Alkaline Phosphatase: 177 U/L — ABNORMAL HIGH (ref 38–126)
Anion gap: 9 (ref 5–15)
BILIRUBIN TOTAL: 0.4 mg/dL (ref 0.3–1.2)
BUN: 6 mg/dL (ref 6–20)
CHLORIDE: 107 mmol/L (ref 101–111)
CO2: 18 mmol/L — AB (ref 22–32)
Calcium: 8.6 mg/dL — ABNORMAL LOW (ref 8.9–10.3)
Creatinine, Ser: 0.56 mg/dL (ref 0.44–1.00)
GFR calc Af Amer: >60 mL/min (ref >60)
GFR calc non Af Amer: >60 mL/min (ref >60)
GLUCOSE: 98 mg/dL (ref 65–99)
POTASSIUM: 3.4 mmol/L — AB (ref 3.5–5.1)
SODIUM: 134 mmol/L — AB (ref 135–145)
Total Protein: 6.7 g/dL (ref 6.5–8.1)

## 2015-10-23 LAB — ECHOCARDIOGRAM COMPLETE
Height: 67 in
Weight: 3792 oz

## 2015-10-23 LAB — RAPID HIV SCREEN (HIV 1/2 AB+AG)
HIV 1/2 ANTIBODIES: NONREACTIVE
HIV-1 P24 ANTIGEN - HIV24: NONREACTIVE

## 2015-10-23 LAB — TYPE AND SCREEN
ABO/RH(D): A POS
Antibody Screen: NEGATIVE

## 2015-10-23 LAB — URINE DRUG SCREEN, QUALITATIVE (ARMC ONLY)
AMPHETAMINES, UR SCREEN: NOT DETECTED
Barbiturates, Ur Screen: NOT DETECTED
Benzodiazepine, Ur Scrn: NOT DETECTED
Cannabinoid 50 Ng, Ur ~~LOC~~: NOT DETECTED
Cocaine Metabolite,Ur ~~LOC~~: NOT DETECTED
MDMA (ECSTASY) UR SCREEN: NOT DETECTED
METHADONE SCREEN, URINE: NOT DETECTED
Opiate, Ur Screen: NOT DETECTED
PHENCYCLIDINE (PCP) UR S: NOT DETECTED
Tricyclic, Ur Screen: NOT DETECTED

## 2015-10-23 LAB — CBC
HCT: 33.6 % — ABNORMAL LOW (ref 35.0–47.0)
Hemoglobin: 11.2 g/dL — ABNORMAL LOW (ref 12.0–16.0)
MCH: 27.6 pg (ref 26.0–34.0)
MCHC: 33.5 g/dL (ref 32.0–36.0)
MCV: 82.4 fL (ref 80.0–100.0)
PLATELETS: 230 10*3/uL (ref 150–440)
RBC: 4.07 MIL/uL (ref 3.80–5.20)
RDW: 18.2 % — AB (ref 11.5–14.5)
WBC: 17.3 10*3/uL — AB (ref 3.6–11.0)

## 2015-10-23 LAB — BRAIN NATRIURETIC PEPTIDE: B NATRIURETIC PEPTIDE 5: 118 pg/mL — AB (ref 0.0–100.0)

## 2015-10-23 LAB — TROPONIN I

## 2015-10-23 MED ORDER — FENTANYL 2.5 MCG/ML W/ROPIVACAINE 0.2% IN NS 100 ML EPIDURAL INFUSION (ARMC-ANES)
EPIDURAL | Status: DC | PRN
  Administered 2015-10-23: 10 mL/h via EPIDURAL

## 2015-10-23 MED ORDER — SODIUM CHLORIDE FLUSH 0.9 % IV SOLN
INTRAVENOUS | Status: AC
  Filled 2015-10-23: qty 10

## 2015-10-23 MED ORDER — LIDOCAINE-EPINEPHRINE (PF) 1.5 %-1:200000 IJ SOLN
INTRAMUSCULAR | Status: DC | PRN
  Administered 2015-10-23: 3 mL via EPIDURAL

## 2015-10-23 MED ORDER — SOD CITRATE-CITRIC ACID 500-334 MG/5ML PO SOLN
30.0000 mL | ORAL | Status: DC | PRN
  Filled 2015-10-23: qty 30

## 2015-10-23 MED ORDER — TERBUTALINE SULFATE 1 MG/ML IJ SOLN: 0.2500 mg | Freq: Once | INTRAMUSCULAR | Status: DC | PRN

## 2015-10-23 MED ORDER — OXYTOCIN 40 UNITS IN LACTATED RINGERS INFUSION - SIMPLE MED
2.5000 [IU]/h | INTRAVENOUS | Status: DC
  Administered 2015-10-23: 2.5 [IU]/h via INTRAVENOUS
  Filled 2015-10-23: qty 1000

## 2015-10-23 MED ORDER — LACTATED RINGERS IV SOLN: 500.0000 mL | INTRAVENOUS | Status: DC | PRN

## 2015-10-23 MED ORDER — SODIUM CHLORIDE FLUSH 0.9 % IV SOLN
INTRAVENOUS | Status: AC
  Administered 2015-10-23: 10 mL
  Filled 2015-10-23: qty 10

## 2015-10-23 MED ORDER — OXYTOCIN 40 UNITS IN LACTATED RINGERS INFUSION - SIMPLE MED
INTRAVENOUS | Status: AC
  Administered 2015-10-23: 1 m[IU]/min via INTRAVENOUS
  Filled 2015-10-23: qty 1000

## 2015-10-23 MED ORDER — ONDANSETRON HCL 4 MG/2ML IJ SOLN
4.0000 mg | Freq: Four times a day (QID) | INTRAMUSCULAR | Status: DC | PRN
Start: 2015-10-23 — End: 2015-10-23

## 2015-10-23 MED ORDER — BUPIVACAINE HCL (PF) 0.25 % IJ SOLN
INTRAMUSCULAR | Status: DC | PRN
  Administered 2015-10-23 (×2): 5 mL via EPIDURAL

## 2015-10-23 MED ORDER — LORAZEPAM 2 MG/ML IJ SOLN
1.0000 mg | Freq: Once | INTRAMUSCULAR | Status: AC
  Administered 2015-10-23: 1 mg via INTRAVENOUS
  Filled 2015-10-23: qty 1

## 2015-10-23 MED ORDER — HYDROMORPHONE HCL 1 MG/ML IJ SOLN
INTRAMUSCULAR | Status: AC
  Administered 2015-10-23: 0.5 mg
  Filled 2015-10-23: qty 1

## 2015-10-23 MED ORDER — ONDANSETRON HCL 4 MG/2ML IJ SOLN
INTRAMUSCULAR | Status: AC
  Administered 2015-10-23: 4 mg
  Filled 2015-10-23: qty 2

## 2015-10-23 MED ORDER — GI COCKTAIL ~~LOC~~
30.0000 mL | Freq: Once | ORAL | Status: AC
  Administered 2015-10-23: 30 mL via ORAL
  Filled 2015-10-23: qty 30

## 2015-10-23 MED ORDER — ONDANSETRON HCL 4 MG/2ML IJ SOLN
4.0000 mg | Freq: Four times a day (QID) | INTRAMUSCULAR | Status: DC | PRN
  Administered 2015-10-24: 4 mg via INTRAVENOUS
  Filled 2015-10-23: qty 2

## 2015-10-23 MED ORDER — SODIUM CHLORIDE FLUSH 0.9 % IV SOLN
INTRAVENOUS | Status: AC
Start: 2015-10-23 — End: 2015-10-23
  Administered 2015-10-23: 10 mL via INTRAVENOUS
  Filled 2015-10-23: qty 20

## 2015-10-23 MED ORDER — LIDOCAINE HCL (PF) 1 % IJ SOLN
30.0000 mL | INTRAMUSCULAR | Status: AC | PRN
  Administered 2015-10-23: 30 mL via SUBCUTANEOUS
  Administered 2015-10-23: 3 mL via SUBCUTANEOUS

## 2015-10-23 MED ORDER — METHYLERGONOVINE MALEATE 0.2 MG PO TABS
0.2000 mg | ORAL_TABLET | Freq: Four times a day (QID) | ORAL | Status: AC
  Administered 2015-10-23 – 2015-10-24 (×4): 0.2 mg via ORAL
  Filled 2015-10-23 (×6): qty 1

## 2015-10-23 MED ORDER — CEFAZOLIN IN D5W 1 GM/50ML IV SOLN
1.0000 g | Freq: Three times a day (TID) | INTRAVENOUS | Status: AC
  Administered 2015-10-23 – 2015-10-24 (×3): 1 g via INTRAVENOUS
  Filled 2015-10-23 (×3): qty 50

## 2015-10-23 MED ORDER — LIDOCAINE HCL (PF) 1 % IJ SOLN
INTRAMUSCULAR | Status: AC
  Administered 2015-10-23: 30 mL via SUBCUTANEOUS
  Filled 2015-10-23: qty 30

## 2015-10-23 MED ORDER — LACTATED RINGERS IV SOLN
INTRAVENOUS | Status: DC
  Administered 2015-10-23: 22:00:00 via INTRAVENOUS

## 2015-10-23 MED ORDER — METHYLERGONOVINE MALEATE 0.2 MG/ML IJ SOLN
INTRAMUSCULAR | Status: AC
  Administered 2015-10-23: 0.2 mg
  Filled 2015-10-23: qty 1

## 2015-10-23 MED ORDER — ACETAMINOPHEN 325 MG PO TABS: 650.0000 mg | ORAL_TABLET | ORAL | Status: DC | PRN

## 2015-10-23 MED ORDER — OXYTOCIN 40 UNITS IN LACTATED RINGERS INFUSION - SIMPLE MED
1.0000 m[IU]/min | INTRAVENOUS | Status: DC
  Administered 2015-10-23: 5 m[IU]/min via INTRAVENOUS
  Administered 2015-10-23: 7 m[IU]/min via INTRAVENOUS
  Administered 2015-10-23: 1 m[IU]/min via INTRAVENOUS

## 2015-10-23 MED ORDER — LORAZEPAM 2 MG/ML IJ SOLN
1.0000 mg | Freq: Four times a day (QID) | INTRAMUSCULAR | Status: DC | PRN
  Administered 2015-10-23: 1 mg via INTRAVENOUS
  Filled 2015-10-23: qty 1

## 2015-10-23 MED ORDER — MISOPROSTOL 200 MCG PO TABS
ORAL_TABLET | ORAL | Status: AC
  Administered 2015-10-23: 800 ug
  Filled 2015-10-23: qty 4

## 2015-10-23 MED ORDER — HYDROMORPHONE HCL 1 MG/ML IJ SOLN
1.0000 mg | INTRAMUSCULAR | Status: DC | PRN
  Administered 2015-10-23 – 2015-10-24 (×2): 1 mg via INTRAVENOUS
  Administered 2015-10-24: 2 mg via INTRAVENOUS
  Filled 2015-10-23 (×2): qty 1
  Filled 2015-10-23: qty 2

## 2015-10-23 MED ORDER — FENTANYL 2.5 MCG/ML W/ROPIVACAINE 0.2% IN NS 100 ML EPIDURAL INFUSION (ARMC-ANES)
EPIDURAL | Status: AC
  Filled 2015-10-23: qty 100

## 2015-10-23 MED ORDER — OXYTOCIN BOLUS FROM INFUSION
500.0000 mL | Freq: Once | INTRAVENOUS | Status: AC
  Administered 2015-10-23: 500 mL via INTRAVENOUS

## 2015-10-23 MED ORDER — CARBOPROST TROMETHAMINE 250 MCG/ML IM SOLN
INTRAMUSCULAR | Status: AC
  Administered 2015-10-23: 250 ug
  Filled 2015-10-23: qty 1

## 2015-10-23 NOTE — Anesthesia Preprocedure Evaluation (Addendum)
Anesthesia Evaluation  Patient identified by MRN, date of birth, ID band Patient awake    Reviewed: Allergy & Precautions, H&P , NPO status , Patient's Chart, lab work & pertinent test results  History of Anesthesia Complications Negative for: history of anesthetic complications  Airway Mallampati: II  TM Distance: >3 FB Neck ROM: full    Dental no notable dental hx.    Pulmonary neg pulmonary ROS, former smoker,    Pulmonary exam normal        Cardiovascular negative cardio ROS Normal cardiovascular exam     Neuro/Psych Depressionnegative neurological ROS     GI/Hepatic negative GI ROS, Neg liver ROS,   Endo/Other  negative endocrine ROS  Renal/GU negative Renal ROS  negative genitourinary   Musculoskeletal scoliosis   Abdominal   Peds  Hematology negative hematology ROS (+)   Anesthesia Other Findings Patient reports scoliosis  Reproductive/Obstetrics (+) Pregnancy                           Anesthesia Physical Anesthesia Plan  ASA: II  Anesthesia Plan: Epidural   Post-op Pain Management:    Induction:   Airway Management Planned:   Additional Equipment:   Intra-op Plan:   Post-operative Plan:   Informed Consent: I have reviewed the patients History and Physical, chart, labs and discussed the procedure including the risks, benefits and alternatives for the proposed anesthesia with the patient or authorized representative who has indicated his/her understanding and acceptance.     Plan Discussed with: Anesthesiologist  Anesthesia Plan Comments:         Anesthesia Quick Evaluation

## 2015-10-23 NOTE — Progress Notes (Signed)
Dr. Lemmie Evens. Raul Del, Pulmonary;  at the [facility] for consult.

## 2015-10-23 NOTE — Progress Notes (Signed)
Patient ID: Sara Gonzales, female   DOB: 1980/04/07, 35 y.o.   MRN: [id number]   I discussed case and results with pulmonology, cardiology and the MFM on call this morning. All results so far reassuring. Cardiology willing to come discuss case with patient. Possibility of viral pleuritis or inflammation high on the list.  Will augment labor to anticipate NSAIDs treatment. Cervix 3.5cm.  FHT Cat I

## 2015-10-23 NOTE — Progress Notes (Signed)
Patient ID: Sara Gonzales, female   DOB: [birth year], 35 y.o.   MRN: [id number]  Some improvement with ativan and left lying positioning. Pain improves with pressure on her left chest. But no overall improvement. Her RR did not drop with ativan or fentanyl.   Vitals:   10/23/15 0712 10/23/15 0737  BP:  (!) 153/83  Pulse:  100  Resp: (!) 21 (!) 26  Temp:  98.8 F (37.1 C)   BP elevated just now to 153/83 from 130s/80s last night.  Otherwise, physical exam is unchanged. CN II-XII intact. Upper and lower extremities intact.  Plan: - Echo cardiogram this morning - MFM consult this morning

## 2015-10-23 NOTE — Progress Notes (Signed)
DEYJA SAVINO is a 35 y.o. G3P1011 at [redacted]w[redacted]d   Subjective: Comfortable with epidural  Objective: BP 123/62 (BP Location: Right Arm)   Pulse 95   Temp 98.3 F (36.8 C) (Oral)   Resp (!) 26   Ht 5\' 7"  (1.702 m)   Wt 237 lb (107.5 kg)   LMP 01/21/2015 (Exact Date)   SpO2 99%   BMI 37.12 kg/m  I/O last 3 completed shifts: In: 730.5 [I.V.:730.5] Out: -  No intake/output data recorded.  FHT:  Cat I UC:   regular, every 5 minutes SVE:   Dilation: 4 Effacement (%): 70 Station: -3 Exam by:: Leafy Ro, MD  Labs: Lab Results  Component Value Date   WBC 17.3 (H) 10/23/2015   HGB 11.2 (L) 10/23/2015   HCT 33.6 (L) 10/23/2015   MCV 82.4 10/23/2015   PLT 230 10/23/2015    Assessment / Plan: AROM for meconium fluid Pitocin at [place], [place] 10/23/2015, 1:28 PM

## 2015-10-23 NOTE — Progress Notes (Signed)
Date: 10/23/2015,   MRN# [id number] Sara Gonzales 07-13-1980 Code Status:     Code Status Orders        Start     Ordered   10/23/15 0956  Full code  Continuous     10/23/15 0956    Code Status History    Date Active Date Inactive Code Status Order ID Comments User Context   This patient has a current code status but no historical code status.     Hosp day:@LENGTHOFSTAYDAYS @ Referring MD: @ATDPROV @      CC: Sob and mild tachycardia, full term pregnancy  HPI: This is a pleasant 35 year old female, former smoker complaining of left chest pain that hurt to take a deep breath, no pain  on movement, no history of recent trauma or long distance travel. No wheezing, syncope, hemoptysis, leg edema or calf pain. Her d dimer is elevated. Chest ct angio did not show pulmonary embolism. Echo showed good function per verbal report.  She mention she has had bouts of wheezing and cough before the pregnancy. Also in the am she is stuffy, suspect mold, there is a leak in the ceiling.    PMHX:   Past Medical History:  Diagnosis Date  . Abnormal blood chemistry   . Allergy   . AR (allergic rhinitis)   . Depression   . Female infertility   . Goiter   . Irregular menses    chronic  . Knee pain, bilateral   . Neoplasm    benign skin  . Spontaneous miscarriage   . Tobacco use disorder   . Viral warts    Surgical Hx:  Past Surgical History:  Procedure Laterality Date  . tubes in ear     Family Hx:  Family History  Problem Relation Age of Onset  . Hypotension Mother     also hypoglycemia  . Migraines Mother   . Diabetes Father   . Kidney disease Father   . Diabetes Brother     type I  . Mental illness Brother     mental disorder; mother with custody.  . Kidney failure Brother    Social Hx:   Social History  Substance Use Topics  . Smoking status: Former Smoker    Packs/day: 1.00    Years: 14.00  . Smokeless tobacco: Never Used  . Alcohol use Yes     Comment: socially    Medication:    Home Medication:    Current Medication: @CURMEDTAB @   Allergies:  Codeine  Review of Systems: Gen:  Denies  fever, sweats, chills HEENT: Denies blurred vision, double vision, ear pain, eye pain, hearing loss, nose bleeds, sore throat Cvc:  No dizziness, chest pain or heaviness Resp: No wheezing, some chest tightness, no hemoptysis   Gi: Denies swallowing difficulty, stomach pain, nausea or vomiting, diarrhea, constipation, bowel incontinence Gu:  Denies bladder incontinence, burning urine Ext:   No Joint pain, stiffness or swelling Skin: No skin rash, easy bruising or bleeding or hives Endoc:  No polyuria, polydipsia , polyphagia or weight change Psych: No depression, insomnia or hallucinations  Other:  All other systems negative  Physical Examination:   VS: BP 129/64   Pulse (!) 107   Temp 98.2 F (36.8 C) (Oral)   Resp (!) 26   Ht 5\' 7"  (1.702 m)   Wt 237 lb (107.5 kg)   LMP 01/21/2015 (Exact Date)   SpO2 99%   BMI 37.12 kg/m   General Appearance: over weight,  no real distress, speaking in full sentences, pleasant family in the room  Neuro: without focal findings, mental status, speech normal, alert and oriented, cranial nerves 2-12 intact, reflexes normal and symmetric, sensation grossly normal  HEENT: PERRLA, EOM intact, no ptosis, no other lesions noticed Pulmonary:.No wheezing, No rales  No Sputum Production:  No rub Cardiovascular:  Normal S1,S2.  No m/r/g.     Abdomen:Benign, Soft, non-tender, full term pregnancy Endoc: No evident thyromegaly, no signs of acromegaly or Cushing features Skin:   warm, no rashes, no ecchymosis  Extremities: normal, no cyanosis, clubbing, no edema, warm with normal capillary refill.   Labs results:   Recent Labs     10/22/15  2046  10/23/15  0949  10/23/15  0958  HGB  12.3   --   11.2*  HCT  37.5   --   33.6*  MCV  82.8   --   82.4  WBC  14.8*   --   17.3*  BUN  7  6   --   CREATININE  0.55  0.56   --    GLUCOSE  116*  98   --   CALCIUM  8.9  8.6*   --   ,    No results for input(s): PH in the last 72 hours.  Invalid input(s): PCO2, PO2, BASEEXCESS, BASEDEFICITE, TFT  Culture results:     Rad results:   Ct Angio Chest Pe W Or Wo Contrast  Result Date: 10/22/2015 CLINICAL DATA:  Chest pain. Thirty-nine weeks pregnant. Patient signed consent for exam. EXAM: CT ANGIOGRAPHY CHEST WITH CONTRAST TECHNIQUE: Multidetector CT imaging of the chest was performed using the standard protocol during bolus administration of intravenous contrast. Multiplanar CT image reconstructions and MIPs were obtained to evaluate the vascular anatomy. CONTRAST:  75 cc IV Isovue 370 COMPARISON:  Same day chest radiograph FINDINGS: Cardiovascular: Heart is top-normal in size. No pericardial effusion or thickening. No large central pulmonary embolus to the third order branches. Beyond this, assessment is limited due to timing of contrast, body habitus and low volume of contrast administered. No aortic aneurysm or dissection. Mediastinum/Nodes: Small right hilar lymph nodes measuring up to 9 mm thick lines, nonspecific possibly reactive. No mediastinal adenopathy. Small hiatal hernia. Lungs/Pleura: No effusion, pneumothorax nor pulmonary consolidation. Upper Abdomen: Negative Musculoskeletal: No acute osseous abnormality. Review of the MIP images confirms the above findings. IMPRESSION: No large central pulmonary embolus identified. Study slightly limited by respiratory motion artifacts and body habitus. No acute pulmonary disease. Electronically Signed   By: Ashley Royalty M.D.   On: 10/22/2015 23:48   Dg Chest Port 1 View  Result Date: 10/22/2015 CLINICAL DATA:  Acute chest pain and dyspnea EXAM: PORTABLE CHEST 1 VIEW COMPARISON:  11/09/2013 FINDINGS: The heart size and mediastinal contours are within normal limits. Both lungs are clear. The visualized skeletal structures are unremarkable. IMPRESSION: No active disease.  Electronically Signed   By: Ashley Royalty M.D.   On: 10/22/2015 21:52    Assessment and Plan: Left chest pain, sound pleuritic ?? Viral. No pulmonary embolism, do not appear to be in peuripartum cardiomyopathy. There may be irritable airway disease/ copd in an ex smoker and ? Exposure to mold. She does not appear to be in pain or anxious.  -continue to observe -moving towards induction -prn xopenex -following   I have personally obtained a history, examined the patient, evaluated laboratory and imaging results, formulated the assessment and plan and placed orders.  The Patient  requires high complexity decision making for assessment and support, frequent evaluation and titration of therapies, application of advanced monitoring technologies and extensive interpretation of multiple databases.   Roxann Vierra,M.D. Pulmonary & Critical care Medicine [facility]

## 2015-10-23 NOTE — Progress Notes (Signed)
Obstetric and Gynecology  Subjective  Sara Gonzales is a 35 y.o. female G3P1011 at 39+2wks who presented on 10/22/2015 for acute onset left sided chest pain. No prior hx of DVT, cardiac disease, asthma, anxiety.   She received an EKG in the ER that was read as normal, was cleared by them, and sent to L&D triage.  On exam, she is acutely dyspneic. Pain started under left breast at 1500 this afternoon and now migrated to include the left scapula. Mild nausea, no vomiting. Occasional contractions, good fetal movement, no LOF, no VB.  No palpations. No fevers.  Her RR significantly elevated at 19-39 breaths per minute HR stable at 100 bpm O2 sats stable at 100% BP 130s/80s  FHT- 140, mod var, +accels, no decels Toco- irregular contractions SVE; 3-4/60/-2, mid position, moderately soft  Labs significant for: Elevated D-dimer to 2500, normal troponins, low CO2, elevated BNP to 156. Rapid Flu negative.   Chest Xray normal CT angiogram pending  Normal first pregnancy, NSVD at term 3 yrs ago Complications of this pregnancy include mild obesity with BMI in low 30s.   Objective   Vitals:   10/23/15 0010 10/23/15 0011 10/23/15 0012 10/23/15 0013  BP:      Pulse:      Resp: (!) 27 (!) 28 (!) 33 (!) 30  Temp:      TempSrc:      SpO2: 99%     Weight:      Height:        No intake or output data in the 24 hours ending 10/23/15 0030  General: moderate distress with breathing, no work of breathing, +dyspnea Cardiovascular: RRR, +systolic ejection murmur heard most loudly at the aortic and mitral sites Pulmonary: CTAB x8 lung fields- no rales, no wheezing Abdomen: Benign. Non-tender, +BS, no guarding. Extremities: No erythema or cords, no calf tenderness, +warmth with normal peripheral pulses. +2+ pitting edema bilatearlly x3 weeks  Labs: Results for orders placed or performed during the hospital encounter of 10/22/15 (from the past 24 hour(s))  Fibrin derivatives D-Dimer ([place]  only)     Status: Abnormal   Collection Time: 10/22/15  8:46 PM  Result Value Ref Range   Fibrin derivatives D-dimer ([facility]) 2,534 (H) 0 - 499  CBC     Status: Abnormal   Collection Time: 10/22/15  8:46 PM  Result Value Ref Range   WBC 14.8 (H) 3.6 - 11.0 K/uL   RBC 4.53 3.80 - 5.20 MIL/uL   Hemoglobin 12.3 12.0 - 16.0 g/dL   HCT 37.5 35.0 - 47.0 %   MCV 82.8 80.0 - 100.0 fL   MCH 27.3 26.0 - 34.0 pg   MCHC 32.9 32.0 - 36.0 g/dL   RDW 18.7 (H) 11.5 - 14.5 %   Platelets 238 150 - 440 K/uL  Urinalysis complete, with microscopic ([facility] only)     Status: Abnormal   Collection Time: 10/22/15  8:46 PM  Result Value Ref Range   Color, Urine YELLOW (A) YELLOW   APPearance CLEAR (A) CLEAR   Glucose, UA 150 (A) NEGATIVE mg/dL   Bilirubin Urine NEGATIVE NEGATIVE   Ketones, ur TRACE (A) NEGATIVE mg/dL   Specific Gravity, Urine 1.017 1.005 - 1.030   Hgb urine dipstick NEGATIVE NEGATIVE   pH 6.0 5.0 - 8.0   Protein, ur NEGATIVE NEGATIVE mg/dL   Nitrite NEGATIVE NEGATIVE   Leukocytes, UA NEGATIVE NEGATIVE   RBC / HPF 0-5 0 - 5 RBC/hpf   WBC, UA 0-5  0 - 5 WBC/hpf   Bacteria, UA RARE (A) NONE SEEN   Squamous Epithelial / LPF 6-30 (A) NONE SEEN   Mucous PRESENT    Hyaline Casts, UA PRESENT    Ca Oxalate Crys, UA PRESENT   Troponin I (q 6hr x 3)     Status: None   Collection Time: 10/22/15  8:46 PM  Result Value Ref Range   Troponin I <0.03 <0.03 ng/mL  Amylase     Status: None   Collection Time: 10/22/15  8:46 PM  Result Value Ref Range   Amylase 32 28 - 100 U/L  Lipase, blood     Status: None   Collection Time: 10/22/15  8:46 PM  Result Value Ref Range   Lipase 21 11 - 51 U/L  Comprehensive metabolic panel     Status: Abnormal   Collection Time: 10/22/15  8:46 PM  Result Value Ref Range   Sodium 137 135 - 145 mmol/L   Potassium 3.2 (L) 3.5 - 5.1 mmol/L   Chloride 108 101 - 111 mmol/L   CO2 17 (L) 22 - 32 mmol/L   Glucose, Bld 116 (H) 65 - 99 mg/dL   BUN 7 6 - 20 mg/dL    Creatinine, Ser 0.55 0.44 - 1.00 mg/dL   Calcium 8.9 8.9 - 10.3 mg/dL   Total Protein 6.9 6.5 - 8.1 g/dL   Albumin 2.9 (L) 3.5 - 5.0 g/dL   AST 22 15 - 41 U/L   ALT 15 14 - 54 U/L   Alkaline Phosphatase 154 (H) 38 - 126 U/L   Total Bilirubin 0.2 (L) 0.3 - 1.2 mg/dL   GFR calc non Af Amer >60 >60 mL/min   GFR calc Af Amer >60 >60 mL/min   Anion gap 12 5 - 15  Brain natriuretic peptide     Status: Abnormal   Collection Time: 10/22/15  8:46 PM  Result Value Ref Range   B Natriuretic Peptide 156.0 (H) 0.0 - 100.0 pg/mL    Cultures: Results for orders placed or performed in visit on 11/09/13  ED Influenza     Status: None   Collection Time: 11/09/13  7:52 PM  Result Value Ref Range Status   Influenza A By PCR NEGATIVE NEGATIVE Final   Influenza B By PCR NEGATIVE NEGATIVE Final   H1N1 flu by pcr NOT DETECTED NOT-DETECTED Final    Comment:                  ----------------------- The Xpert Flu assay (FDA approval for nasal aspirates or washes and nasopharyngeal swab specimens), is intended as an aid in the diagnosis of influenza and should not be used as a sole basis for treatment.     Imaging: Ct Angio Chest Pe W Or Wo Contrast  Result Date: 10/22/2015 CLINICAL DATA:  Chest pain. Thirty-nine weeks pregnant. Patient signed consent for exam. EXAM: CT ANGIOGRAPHY CHEST WITH CONTRAST TECHNIQUE: Multidetector CT imaging of the chest was performed using the standard protocol during bolus administration of intravenous contrast. Multiplanar CT image reconstructions and MIPs were obtained to evaluate the vascular anatomy. CONTRAST:  75 cc IV Isovue 370 COMPARISON:  Same day chest radiograph FINDINGS: Cardiovascular: Heart is top-normal in size. No pericardial effusion or thickening. No large central pulmonary embolus to the third order branches. Beyond this, assessment is limited due to timing of contrast, body habitus and low volume of contrast administered. No aortic aneurysm or dissection.  Mediastinum/Nodes: Small right hilar lymph nodes measuring up  to 9 mm thick lines, nonspecific possibly reactive. No mediastinal adenopathy. Small hiatal hernia. Lungs/Pleura: No effusion, pneumothorax nor pulmonary consolidation. Upper Abdomen: Negative Musculoskeletal: No acute osseous abnormality. Review of the MIP images confirms the above findings. IMPRESSION: No large central pulmonary embolus identified. Study slightly limited by respiratory motion artifacts and body habitus. No acute pulmonary disease. Electronically Signed   By: Ashley Royalty M.D.   On: 10/22/2015 23:48   Dg Chest Port 1 View  Result Date: 10/22/2015 CLINICAL DATA:  Acute chest pain and dyspnea EXAM: PORTABLE CHEST 1 VIEW COMPARISON:  11/09/2013 FINDINGS: The heart size and mediastinal contours are within normal limits. Both lungs are clear. The visualized skeletal structures are unremarkable. IMPRESSION: No active disease. Electronically Signed   By: Ashley Royalty M.D.   On: 10/22/2015 21:52    Assessment   35 y.o. G3P1011 at 39+2wks  Hospital Day: 2   Plan   1. DDx includes PE most likely. Other ddx include cardiomyopathy, anxiety, labor. Less likely infectious with modestly elevated WBC, but afebrile.  - CT angiogram negative for PE. Will not initiate treatment. - Given stable vitals except for tachypnea, will continue remote tele monitoring, fetal monitoring, tocometry, serial BP and continuous O2 sats. - Plan for maternal echo in am - If acutely worsens, will involve intensivist/pulmonologist and consider transfer to [facility]. No current evidence to require arterial blood gas.  - Will plan for delivery if no progression. She is making slow cervical change.  - iv narcotics prn and iv ativan to see if sympathetic suppression improves sx. Will give zofran prn.   Discussion with [facility] on call to confirm plan. Discussion with reading radiologist at [facility] re: echo --> unable to perform tonight but will order for  am.

## 2015-10-23 NOTE — Consult Note (Signed)
[facility] Consultation Note  Patient ID: Sara Gonzales, MRN: [id number], DOB/AGE: [birth year] 35 y.o. Admit date: 10/22/2015   Date of Consult: 10/23/2015 Primary Physician: Reginia Forts, MD Primary Cardiologist: None  Chief Complaint:  Chief Complaint  Patient presents with  . Abdominal Pain    upper abdomen/chest area, wraps around to back   Reason for Consult: HPI    Abdominal Pain   Additional comments: upper abdomen/chest area, wraps around to back     Last edited by Mackie Pai, RN on 10/22/2015  7:23 PM. (History)    Chest pain  HPI: 35 y.o. female with no previous history of cardiovascular disease who has late term pregnancy having new onset of left-sided chest discomfort. This left-sided chest discomfort started yesterday at 3 PM and accelerated in intensity and frequency and lasted an entire day for over 24 hours. Over a 12 hour period of slightly improved without any specific treatment at all. The patient was short of breath during this time with some labored breathing. Further diagnostic testing including CAT scan was normal as well as chest x-ray EKG and echocardiogram. This suggests that there was NO evidence of pulmonary embolism aortic dissection, arterial dissection, myocardial infarction, pericardial effusion or pericarditis, and/or congestive heart failure with or without valvular heart disease. The patient has had significant improvement of symptoms at this time and is hemodynamically stable and has had no evidence of issues hemodynamically through her early hospitalization area therefore at this time it appears to be inflammatory in nature atypical in nature and does not appear to be cardiac etiology  Past Medical History:  Diagnosis Date  . Abnormal blood chemistry   . Allergy   . AR (allergic rhinitis)   . Depression   . Female infertility   . Goiter   . Irregular menses    chronic  . Knee pain, bilateral   . Neoplasm    benign skin   . Spontaneous miscarriage   . Tobacco use disorder   . Viral warts       Surgical History:  Past Surgical History:  Procedure Laterality Date  . tubes in ear       Home Meds: Prior to Admission medications   Medication Sig Start Date End Date Taking? Authorizing Provider  Prenatal Vit-Fe Fumarate-FA (PRENATAL MULTIVITAMIN) TABS tablet Take 1 tablet by mouth daily at 12 noon.   Yes Historical Provider, MD    Inpatient Medications:  . oxytocin 40 units in LR 1000 mL  500 mL Intravenous Once  . sodium chloride flush       . lactated ringers 100 mL/hr (10/23/15 0825)  . lactated ringers    . oxytocin    . oxytocin 7 milli-units/min (10/23/15 1320)    Allergies:  Allergies  Allergen Reactions  . Codeine Rash    Social History   Social History  . Marital status: Single    Spouse name: N/A  . Number of children: 1  . Years of education: 59   Occupational History  . medical assistant [employer]   Social History Main Topics  . Smoking status: Former Smoker    Packs/day: 1.00    Years: 14.00  . Smokeless tobacco: Never Used  . Alcohol use Yes     Comment: socially  . Drug use: No  . Sexual activity: Yes    Birth control/ protection: None   Other Topics Concern  . Not on file   Social History Narrative   Marital status: married.  Dating x 1 year.  No domestic abuse; happy.      Children: one daughter [name] 32 months)      Lives: husband, daughter, step daughter Jillyn Ledger)      Employment: works with [employer]; MA. Full time.      Tobacco: 1 ppd; down to 1/2 ppd in 2016; smoking x 14 years.      Alcohol: socially; no DWIs.      Drugs: none      Exercise: not currently      Seatlbelt: 100%;       Sunscreen: rare sun exposure; no tanning.      Sexual activity: two total lifetime partner; no STDs.  No contraception.     Family History  Problem Relation Age of Onset  . Hypotension Mother     also hypoglycemia  . Migraines Mother   . Diabetes Father    . Kidney disease Father   . Diabetes Brother     type I  . Mental illness Brother     mental disorder; mother with custody.  . Kidney failure Brother      Review of Systems Positive for Chest pain Negative for: General:  chills, fever, night sweats or weight changes.  Cardiovascular: PND orthopnea syncope dizziness  Dermatological skin lesions rashes Respiratory: Cough congestion Urologic: Frequent urination urination at night and hematuria Abdominal: negative for nausea, vomiting, diarrhea, bright red blood per rectum, melena, or hematemesis Neurologic: negative for visual changes, and/or hearing changes  All other systems reviewed and are otherwise negative except as noted above.  Labs:  Recent Labs  10/22/15 2046 10/23/15 0949  TROPONINI <0.03 <0.03   Lab Results  Component Value Date   WBC 17.3 (H) 10/23/2015   HGB 11.2 (L) 10/23/2015   HCT 33.6 (L) 10/23/2015   MCV 82.4 10/23/2015   PLT 230 10/23/2015    Recent Labs Lab 10/23/15 0949  NA 134*  K 3.4*  CL 107  CO2 18*  BUN 6  CREATININE 0.56  CALCIUM 8.6*  PROT 6.7  BILITOT 0.4  ALKPHOS 177*  ALT 16  AST 24  GLUCOSE 98   Lab Results  Component Value Date   CHOL 176 10/25/2014   HDL 33 (L) 10/25/2014   LDLCALC 106 10/25/2014   TRIG 185 (H) 10/25/2014   No results found for: DDIMER  Radiology/Studies:  Ct Angio Chest Pe W Or Wo Contrast  Result Date: 10/22/2015 CLINICAL DATA:  Chest pain. Thirty-nine weeks pregnant. Patient signed consent for exam. EXAM: CT ANGIOGRAPHY CHEST WITH CONTRAST TECHNIQUE: Multidetector CT imaging of the chest was performed using the standard protocol during bolus administration of intravenous contrast. Multiplanar CT image reconstructions and MIPs were obtained to evaluate the vascular anatomy. CONTRAST:  75 cc IV Isovue 370 COMPARISON:  Same day chest radiograph FINDINGS: Cardiovascular: Heart is top-normal in size. No pericardial effusion or thickening. No large  central pulmonary embolus to the third order branches. Beyond this, assessment is limited due to timing of contrast, body habitus and low volume of contrast administered. No aortic aneurysm or dissection. Mediastinum/Nodes: Small right hilar lymph nodes measuring up to 9 mm thick lines, nonspecific possibly reactive. No mediastinal adenopathy. Small hiatal hernia. Lungs/Pleura: No effusion, pneumothorax nor pulmonary consolidation. Upper Abdomen: Negative Musculoskeletal: No acute osseous abnormality. Review of the MIP images confirms the above findings. IMPRESSION: No large central pulmonary embolus identified. Study slightly limited by respiratory motion artifacts and body habitus. No acute pulmonary disease. Electronically Signed   By: Shanon Brow  Randel Pigg M.D.   On: 10/22/2015 23:48   Dg Chest Port 1 View  Result Date: 10/22/2015 CLINICAL DATA:  Acute chest pain and dyspnea EXAM: PORTABLE CHEST 1 VIEW COMPARISON:  11/09/2013 FINDINGS: The heart size and mediastinal contours are within normal limits. Both lungs are clear. The visualized skeletal structures are unremarkable. IMPRESSION: No active disease. Electronically Signed   By: Ashley Royalty M.D.   On: 10/22/2015 21:52    EKG: Normal sinus rhythm  Weights: Filed Weights   10/22/15 1920 10/23/15 1028  Weight: 107.5 kg (237 lb) 107.5 kg (237 lb)     Physical Exam: Blood pressure 123/62, pulse 95, temperature 98.3 F (36.8 C), temperature source Oral, resp. rate (!) 26, height 5\' 7"  (1.702 m), weight 107.5 kg (237 lb), last menstrual period 01/21/2015, SpO2 99 %. Body mass index is 37.12 kg/m. General: Well developed, well nourished, in no acute distress. Head eyes ears nose throat: Normocephalic, atraumatic, sclera non-icteric, no xanthomas, nares are without discharge. No apparent thyromegaly and/or mass  Lungs: Normal respiratory effort.  no wheezes, no rales, no rhonchi.  Heart: RRR with normal S1 S2. no murmur gallop, no rub, PMI is normal size  and placement, carotid upstroke normal without bruit, jugular venous pressure is normal Abdomen: Soft, non-tender,  distended with child with normoactive bowel sounds. Extremities: No edema. no cyanosis, no clubbing, no ulcers  Peripheral : 2+ bilateral upper extremity pulses, 2+ bilateral femoral pulses, 2+ bilateral dorsal pedal pulse Neuro: Alert and oriented. No facial asymmetry. No focal deficit. Moves all extremities spontaneously. Musculoskeletal: Normal muscle tone without kyphosis Psych:  Responds to questions appropriately with a normal affect.    Assessment: 35 year old female with atypical chest pain WITHOUT evidence of pulmonary embolism, myocardial infarction, aortic and or arterial dissection, valvular heart disease, congestive heart failure, or pneumonia.  Plan: 1. Continue supportive care and follow with monitoring 2. No further cardiac diagnostics necessary at this time  Signed, Corey Skains M.D. [facility] 10/23/2015, 1:25 PM

## 2015-10-23 NOTE — Progress Notes (Signed)
Sara Gonzales is a 35 y.o. G3P1011 at [redacted]w[redacted]d   Subjective: Pt feeling pressure, fully dilated and will begin pushing  Objective: BP 118/69 (BP Location: Right Arm)   Pulse (!) 102   Temp 98.9 F (37.2 C) (Oral)   Resp (!) 22   Ht 5\' 7"  (1.702 m)   Wt 237 lb (107.5 kg)   LMP 01/21/2015 (Exact Date)   SpO2 99%   BMI 37.12 kg/m  I/O last 3 completed shifts: In: 730.5 [I.V.:730.5] Out: -  Total I/O In: -  Out: 325 [Urine:325]  FHT:  FHR: 140 bpm, variability: moderate,  accelerations:  Present,  decelerations:  Present early, variable UC:   regular, every 2 minutes SVE:   Dilation: 10 Effacement (%): 100 Station: +1 Exam by:: Millner, RN   Labs: Lab Results  Component Value Date   WBC 17.3 (H) 10/23/2015   HGB 11.2 (L) 10/23/2015   HCT 33.6 (L) 10/23/2015   MCV 82.4 10/23/2015   PLT 230 10/23/2015    Assessment / Plan: Anticipate vaginal delivery Epidural bolus if needed  Topher Buenaventura 10/23/2015, 5:56 PM

## 2015-10-23 NOTE — Progress Notes (Signed)
Radiology tech at the [facility] performing Echocardiogram, fetal monitor not recording.

## 2015-10-23 NOTE — Anesthesia Procedure Notes (Addendum)
Epidural Patient location during procedure: OB  Staffing Anesthesiologist: Alvin Critchley Resident/CRNA: Doreen Salvage Performed: resident/CRNA   Preanesthetic Checklist Completed: patient identified, site marked, surgical consent, pre-op evaluation, timeout performed, IV checked, risks and benefits discussed and monitors and equipment checked  Epidural Patient position: sitting Prep: Betadine Patient monitoring: heart rate, continuous pulse ox and blood pressure Approach: midline Location: L4-L5 Injection technique: LOR saline  Needle:  Needle type: Tuohy  Needle gauge: 18 G Needle length: 9 cm and 9 Needle insertion depth: 8.5 cm Catheter type: closed end flexible Catheter size: 20 Guage Catheter at skin depth: 14 cm Test dose: negative and 1.5% lidocaine with Epi 1:200 K  Assessment Sensory level: T10  Additional Notes Pt. Evaluated and documentation done after procedure finished. Patient identified. Risks/Benefits/Options discussed with patient including but not limited to bleeding, infection, nerve damage, paralysis, failed block, incomplete pain control, headache, blood pressure changes, nausea, vomiting, reactions to medication both or allergic, itching and postpartum back pain. Confirmed with bedside nurse the patient's most recent platelet count. Confirmed with patient that they are not currently taking any anticoagulation, have any bleeding history or any family history of bleeding disorders. Patient expressed understanding and wished to proceed. All questions were answered. Sterile technique was used throughout the entire procedure. Please see nursing notes for vital signs. Test dose was given through epidural catheter and negative prior to continuing to dose epidural or start infusion. Warning signs of high block given to the patient including shortness of breath, tingling/numbness in hands, complete motor block, or any concerning symptoms with instructions to call for  help. Patient was given instructions on fall risk and not to get out of bed. All questions and concerns addressed with instructions to call with any issues or inadequate analgesia.   Patient tolerated the insertion well without complications.  Reason for block:procedure for pain

## 2015-10-23 NOTE — Progress Notes (Addendum)
Echocardiogram ([place])  currently being performed at the [facility].

## 2015-10-23 NOTE — H&P (Signed)
Late entry note: This is a duplicate of the triage note to serve as her H&P from 10/23/15 at 12:11 am  Sara Gonzales is a 35 y.o. female G3P1011 at 39+2wks who presented on 10/22/2015 for acute onset left sided chest pain. No prior hx of DVT, cardiac disease, asthma, anxiety.   She received an EKG in the ER that was read as normal, was cleared by them, and sent to L&D triage.  On exam, she is acutely dyspneic. Pain started under left breast at 1500 this afternoon and now migrated to include the left scapula. Mild nausea, no vomiting. Occasional contractions, good fetal movement, no LOF, no VB.  No palpations. No fevers.  Her RR significantly elevated at 19-39 breaths per minute HR stable at 100 bpm O2 sats stable at 100% BP 130s/80s  FHT- 140, mod var, +accels, no decels Toco- irregular contractions SVE; 3-4/60/-2, mid position, moderately soft  Labs significant for: Elevated D-dimer to 2500, normal troponins, low CO2, elevated BNP to 156. Rapid Flu negative.   Chest Xray normal CT angiogram pending  Normal first pregnancy, NSVD at term 3 yrs ago Complications of this pregnancy include mild obesity with BMI in low 30s.   Objective         Vitals:   10/23/15 0010 10/23/15 0011 10/23/15 0012 10/23/15 0013  BP:      Pulse:      Resp: (!) 27 (!) 28 (!) 33 (!) 30  Temp:      TempSrc:      SpO2: 99%     Weight:      Height:        No intake or output data in the 24 hours ending 10/23/15 0030  General: moderate distress with breathing, no work of breathing, +dyspnea Cardiovascular: RRR, +systolic ejection murmur heard most loudly at the aortic and mitral sites Pulmonary: CTAB x8 lung fields- no rales, no wheezing Abdomen: Benign. Non-tender, +BS, no guarding. Extremities: No erythema or cords, no calf tenderness, +warmth with normal peripheral pulses. +2+ pitting edema bilatearlly x3 weeks  Labs: Lab Results Last 24 Hours    Results for orders placed or performed during the hospital encounter of 10/22/15 (from the past 24 hour(s))  Fibrin derivatives D-Dimer ([facility] only)     Status: Abnormal   Collection Time: 10/22/15  8:46 PM  Result Value Ref Range   Fibrin derivatives D-dimer ([facility]) 2,534 (H) 0 - 499  CBC     Status: Abnormal   Collection Time: 10/22/15  8:46 PM  Result Value Ref Range   WBC 14.8 (H) 3.6 - 11.0 K/uL   RBC 4.53 3.80 - 5.20 MIL/uL   Hemoglobin 12.3 12.0 - 16.0 g/dL   HCT 37.5 35.0 - 47.0 %   MCV 82.8 80.0 - 100.0 fL   MCH 27.3 26.0 - 34.0 pg   MCHC 32.9 32.0 - 36.0 g/dL   RDW 18.7 (H) 11.5 - 14.5 %   Platelets 238 150 - 440 K/uL  Urinalysis complete, with microscopic ([facility] only)     Status: Abnormal   Collection Time: 10/22/15  8:46 PM  Result Value Ref Range   Color, Urine YELLOW (A) YELLOW   APPearance CLEAR (A) CLEAR   Glucose, UA 150 (A) NEGATIVE mg/dL   Bilirubin Urine NEGATIVE NEGATIVE   Ketones, ur TRACE (A) NEGATIVE mg/dL   Specific Gravity, Urine 1.017 1.005 - 1.030   Hgb urine dipstick NEGATIVE NEGATIVE   pH 6.0 5.0 - 8.0   Protein, ur  NEGATIVE NEGATIVE mg/dL   Nitrite NEGATIVE NEGATIVE   Leukocytes, UA NEGATIVE NEGATIVE   RBC / HPF 0-5 0 - 5 RBC/hpf   WBC, UA 0-5 0 - 5 WBC/hpf   Bacteria, UA RARE (A) NONE SEEN   Squamous Epithelial / LPF 6-30 (A) NONE SEEN   Mucous PRESENT    Hyaline Casts, UA PRESENT    Ca Oxalate Crys, UA PRESENT   Troponin I (q 6hr x 3)     Status: None   Collection Time: 10/22/15  8:46 PM  Result Value Ref Range   Troponin I <0.03 <0.03 ng/mL  Amylase     Status: None   Collection Time: 10/22/15  8:46 PM  Result Value Ref Range   Amylase 32 28 - 100 U/L  Lipase, blood     Status: None   Collection Time: 10/22/15  8:46 PM  Result Value Ref Range   Lipase 21 11 - 51 U/L  Comprehensive metabolic panel     Status: Abnormal   Collection Time: 10/22/15  8:46 PM  Result Value Ref Range   Sodium 137  135 - 145 mmol/L   Potassium 3.2 (L) 3.5 - 5.1 mmol/L   Chloride 108 101 - 111 mmol/L   CO2 17 (L) 22 - 32 mmol/L   Glucose, Bld 116 (H) 65 - 99 mg/dL   BUN 7 6 - 20 mg/dL   Creatinine, Ser 0.55 0.44 - 1.00 mg/dL   Calcium 8.9 8.9 - 10.3 mg/dL   Total Protein 6.9 6.5 - 8.1 g/dL   Albumin 2.9 (L) 3.5 - 5.0 g/dL   AST 22 15 - 41 U/L   ALT 15 14 - 54 U/L   Alkaline Phosphatase 154 (H) 38 - 126 U/L   Total Bilirubin 0.2 (L) 0.3 - 1.2 mg/dL   GFR calc non Af Amer >60 >60 mL/min   GFR calc Af Amer >60 >60 mL/min   Anion gap 12 5 - 15  Brain natriuretic peptide     Status: Abnormal   Collection Time: 10/22/15  8:46 PM  Result Value Ref Range   B Natriuretic Peptide 156.0 (H) 0.0 - 100.0 pg/mL      Cultures:        Results for orders placed or performed in visit on 11/09/13  ED Influenza     Status: None   Collection Time: 11/09/13  7:52 PM  Result Value Ref Range Status   Influenza A By PCR NEGATIVE NEGATIVE Final   Influenza B By PCR NEGATIVE NEGATIVE Final   H1N1 flu by pcr NOT DETECTED NOT-DETECTED Final    Comment:                  ----------------------- The Xpert Flu assay (FDA approval for nasal aspirates or washes and nasopharyngeal swab specimens), is intended as an aid in the diagnosis of influenza and should not be used as a sole basis for treatment.    Imaging:  Imaging Results  Ct Angio Chest Pe W Or Wo Contrast  Result Date: 10/22/2015 CLINICAL DATA:  Chest pain. Thirty-nine weeks pregnant. Patient signed consent for exam. EXAM: CT ANGIOGRAPHY CHEST WITH CONTRAST TECHNIQUE: Multidetector CT imaging of the chest was performed using the standard protocol during bolus administration of intravenous contrast. Multiplanar CT image reconstructions and MIPs were obtained to evaluate the vascular anatomy. CONTRAST:  75 cc IV Isovue 370 COMPARISON:  Same day chest radiograph FINDINGS: Cardiovascular: Heart is top-normal in size. No pericardial  effusion or thickening.  No large central pulmonary embolus to the third order branches. Beyond this, assessment is limited due to timing of contrast, body habitus and low volume of contrast administered. No aortic aneurysm or dissection. Mediastinum/Nodes: Small right hilar lymph nodes measuring up to 9 mm thick lines, nonspecific possibly reactive. No mediastinal adenopathy. Small hiatal hernia. Lungs/Pleura: No effusion, pneumothorax nor pulmonary consolidation. Upper Abdomen: Negative Musculoskeletal: No acute osseous abnormality. Review of the MIP images confirms the above findings. IMPRESSION: No large central pulmonary embolus identified. Study slightly limited by respiratory motion artifacts and body habitus. No acute pulmonary disease. Electronically Signed   By: Ashley Royalty M.D.   On: 10/22/2015 23:48   Dg Chest Port 1 View  Result Date: 10/22/2015 CLINICAL DATA:  Acute chest pain and dyspnea EXAM: PORTABLE CHEST 1 VIEW COMPARISON:  11/09/2013 FINDINGS: The heart size and mediastinal contours are within normal limits. Both lungs are clear. The visualized skeletal structures are unremarkable. IMPRESSION: No active disease. Electronically Signed   By: Ashley Royalty M.D.   On: 10/22/2015 21:52     Assessment   35 y.o. G3P1011 at 39+2wks  Hospital Day: 2   Plan   1. DDx includes PE most likely. Other ddx include cardiomyopathy, anxiety, labor. Less likely infectious with modestly elevated WBC, but afebrile.  - CT angiogram negative for PE. Will not initiate treatment. - Given stable vitals except for tachypnea, will continue remote tele monitoring, fetal monitoring, tocometry, serial BP and continuous O2 sats. - Plan for maternal echo in am - If acutely worsens, will involve intensivist/pulmonologist and consider transfer to [facility] M[facility]. No current evidence to require arterial blood gas.  - Will plan for delivery if no progression. She is making slow cervical change.  - iv narcotics  prn and iv ativan to see if sympathetic suppression improves sx. Will give zofran prn.   Discussion with [facility] on call to confirm plan. Discussion with reading radiologist at [facility] re: echo --> unable to perform tonight but will order for am.

## 2015-10-23 NOTE — Progress Notes (Signed)
*  PRELIMINARY RESULTS* Echocardiogram 2D Echocardiogram has been performed.  Sara Gonzales 10/23/2015, 8:42 AM

## 2015-10-24 ENCOUNTER — Inpatient Hospital Stay: Payer: Commercial Managed Care - PPO

## 2015-10-24 LAB — CBC
HEMATOCRIT: 31.1 % — AB (ref 35.0–47.0)
HEMOGLOBIN: 10.5 g/dL — AB (ref 12.0–16.0)
MCH: 27.6 pg (ref 26.0–34.0)
MCHC: 33.7 g/dL (ref 32.0–36.0)
MCV: 81.8 fL (ref 80.0–100.0)
Platelets: 212 10*3/uL (ref 150–440)
RBC: 3.8 MIL/uL (ref 3.80–5.20)
RDW: 18.3 % — ABNORMAL HIGH (ref 11.5–14.5)
WBC: 18.5 10*3/uL — ABNORMAL HIGH (ref 3.6–11.0)

## 2015-10-24 LAB — RPR: RPR Ser Ql: NONREACTIVE

## 2015-10-24 MED ORDER — DIPHENHYDRAMINE HCL 25 MG PO CAPS: 25.0000 mg | ORAL_CAPSULE | Freq: Four times a day (QID) | ORAL | Status: DC | PRN

## 2015-10-24 MED ORDER — ONDANSETRON HCL 4 MG PO TABS: 4.0000 mg | ORAL_TABLET | ORAL | Status: DC | PRN

## 2015-10-24 MED ORDER — DIBUCAINE 1 % RE OINT: 1.0000 "application " | TOPICAL_OINTMENT | RECTAL | Status: DC | PRN

## 2015-10-24 MED ORDER — ACETAMINOPHEN 325 MG PO TABS: 650.0000 mg | ORAL_TABLET | ORAL | Status: DC | PRN

## 2015-10-24 MED ORDER — SODIUM CHLORIDE 0.9% FLUSH
3.0000 mL | Freq: Two times a day (BID) | INTRAVENOUS | Status: DC
  Administered 2015-10-24: 3 mL via INTRAVENOUS

## 2015-10-24 MED ORDER — FERROUS FUMARATE 324 (106 FE) MG PO TABS
1.0000 | ORAL_TABLET | Freq: Two times a day (BID) | ORAL | Status: DC
  Administered 2015-10-24 – 2015-10-25 (×3): 106 mg via ORAL
  Filled 2015-10-24 (×3): qty 1

## 2015-10-24 MED ORDER — MEASLES, MUMPS & RUBELLA VAC ~~LOC~~ INJ: 0.5000 mL | INJECTION | Freq: Once | SUBCUTANEOUS | Status: DC

## 2015-10-24 MED ORDER — BISACODYL 10 MG RE SUPP: 10.0000 mg | Freq: Every day | RECTAL | Status: DC | PRN

## 2015-10-24 MED ORDER — COCONUT OIL OIL: 1.0000 "application " | TOPICAL_OIL | Status: DC | PRN

## 2015-10-24 MED ORDER — MEASLES, MUMPS & RUBELLA VAC ~~LOC~~ INJ
0.5000 mL | INJECTION | Freq: Once | SUBCUTANEOUS | Status: DC
  Filled 2015-10-24: qty 0.5

## 2015-10-24 MED ORDER — ZOLPIDEM TARTRATE 5 MG PO TABS: 5.0000 mg | ORAL_TABLET | Freq: Every evening | ORAL | Status: DC | PRN

## 2015-10-24 MED ORDER — BENZOCAINE-MENTHOL 20-0.5 % EX AERO: 1.0000 "application " | INHALATION_SPRAY | CUTANEOUS | Status: DC | PRN

## 2015-10-24 MED ORDER — WITCH HAZEL-GLYCERIN EX PADS: 1.0000 "application " | MEDICATED_PAD | CUTANEOUS | Status: DC | PRN

## 2015-10-24 MED ORDER — IBUPROFEN 600 MG PO TABS
600.0000 mg | ORAL_TABLET | Freq: Four times a day (QID) | ORAL | Status: DC
  Administered 2015-10-24 – 2015-10-25 (×5): 600 mg via ORAL
  Filled 2015-10-24 (×5): qty 1

## 2015-10-24 MED ORDER — TETANUS-DIPHTH-ACELL PERTUSSIS 5-2.5-18.5 LF-MCG/0.5 IM SUSP: 0.5000 mL | Freq: Once | INTRAMUSCULAR | Status: DC

## 2015-10-24 MED ORDER — SODIUM CHLORIDE 0.9% FLUSH
3.0000 mL | INTRAVENOUS | Status: DC | PRN
  Administered 2015-10-25: 3 mL via INTRAVENOUS
  Filled 2015-10-24: qty 3

## 2015-10-24 MED ORDER — FLEET ENEMA 7-19 GM/118ML RE ENEM: 1.0000 | ENEMA | Freq: Every day | RECTAL | Status: DC | PRN

## 2015-10-24 MED ORDER — ONDANSETRON HCL 4 MG/2ML IJ SOLN: 4.0000 mg | INTRAMUSCULAR | Status: DC | PRN

## 2015-10-24 MED ORDER — SODIUM CHLORIDE 0.9 % IV SOLN: 250.0000 mL | INTRAVENOUS | Status: DC | PRN

## 2015-10-24 MED ORDER — PRENATAL MULTIVITAMIN CH
1.0000 | ORAL_TABLET | Freq: Every day | ORAL | Status: DC
  Administered 2015-10-25: 1 via ORAL
  Filled 2015-10-24 (×2): qty 1

## 2015-10-24 MED ORDER — SENNOSIDES-DOCUSATE SODIUM 8.6-50 MG PO TABS
2.0000 | ORAL_TABLET | ORAL | Status: DC
  Administered 2015-10-25: 2 via ORAL
  Filled 2015-10-24: qty 2

## 2015-10-24 MED ORDER — SIMETHICONE 80 MG PO CHEW: 80.0000 mg | CHEWABLE_TABLET | ORAL | Status: DC | PRN

## 2015-10-24 NOTE — Anesthesia Postprocedure Evaluation (Signed)
Anesthesia Post Note  Patient: Sara Gonzales  Procedure(s) Performed: * No procedures listed *  Patient location during evaluation: Mother Baby Anesthesia Type: Epidural Level of consciousness: awake, awake and alert and oriented Pain management: pain level controlled Vital Signs Assessment: vitals unstable and post-procedure vital signs reviewed and stable Respiratory status: spontaneous breathing, nonlabored ventilation and respiratory function stable Cardiovascular status: blood pressure returned to baseline and stable Postop Assessment: no headache, no backache, adequate PO intake and patient able to bend at knees Anesthetic complications: no    Last Vitals:  Vitals:   10/24/15 0655 10/24/15 0656  BP:    Pulse:    Resp: (!) 24 18  Temp:      Last Pain:  Vitals:   10/24/15 0545  TempSrc:   PainSc: 0-No pain                 [name]

## 2015-10-24 NOTE — Progress Notes (Signed)
RN to the [facility] for a.m. Assessment.  Patient alert with spouse and baby at the bedside.  Spouse awake and holding baby.  Pt. Disconnected from telemetry monitor, (telemetry notified). Pain 2/10 with lower abd. & vaginal discomfort from delivery. Desires menu to order breakfast. Menu given, order placed. RN assist pt. Up to BR for pericare, steady gait, no dizziness, nor any visual changes. Pericare provided, gown changed to BF gown.  Pt. Transferred to PP #[id number] via [place], infant transferred via bassinet, report given to [place]. No complaints verbalized by patient, Stable.

## 2015-10-24 NOTE — Progress Notes (Signed)
Post Partum Day 1 Subjective: "I feel okay and no further problems with my breathing", I have some cramping going on which is my biggest issue"   Objective: Blood pressure 136/88, pulse 93, temperature 98 F (36.7 C), temperature source Oral, resp. rate (!) 22, height 5\' 7"  (1.702 m), weight 237 lb (107.5 kg), last menstrual period 01/21/2015, SpO2 97 %, unknown if currently breastfeeding.  Physical Exam:  General:A,A&O x3 Heart: S1S2, RRR, No M/R/G. Lungs: CTA bilat, no W/R/R., Pleuritic pain seems to have resolved Lochia: mod, no clots, Backri discontinued this am Uterine Fundus:FF and U-1.   DVT Evaluation: Neg Homan's    Recent Labs  10/23/15 0958 10/24/15 0543  HGB 11.2* 10.5*  HCT 33.6* 31.1*  WBC 17.3* 18.5*  PLT 230 212    Assessment/Plan: A: NSVD Day 1 stable 2. PP Hemorrhage stable 3. Lt chest pain: no further pain, no evidence of PE, pneumo, CARDS: Rt BBB but, no cardiomyopathy, no MI,   P: Continue to observe 2. Monitor closely and escort to [place] [facility] [place]     LOS: 1 day   Catheryn Bacon 10/24/2015, 9:02 AM

## 2015-10-25 LAB — CBC
HEMATOCRIT: 28.3 % — AB (ref 35.0–47.0)
Hemoglobin: 9.3 g/dL — ABNORMAL LOW (ref 12.0–16.0)
MCH: 27.7 pg (ref 26.0–34.0)
MCHC: 33 g/dL (ref 32.0–36.0)
MCV: 83.8 fL (ref 80.0–100.0)
Platelets: 226 10*3/uL (ref 150–440)
RBC: 3.38 MIL/uL — AB (ref 3.80–5.20)
RDW: 18.4 % — AB (ref 11.5–14.5)
WBC: 11.4 10*3/uL — AB (ref 3.6–11.0)

## 2015-10-25 MED ORDER — INFLUENZA VAC SPLIT QUAD 0.5 ML IM SUSY: 0.5000 mL | PREFILLED_SYRINGE | INTRAMUSCULAR | Status: DC

## 2015-10-25 MED ORDER — TETANUS-DIPHTH-ACELL PERTUSSIS 5-2.5-18.5 LF-MCG/0.5 IM SUSP: 0.5000 mL | Freq: Once | INTRAMUSCULAR | Status: DC

## 2015-10-25 MED ORDER — FERROUS FUMARATE 324 (106 FE) MG PO TABS: 1.0000 | ORAL_TABLET | Freq: Two times a day (BID) | ORAL | 2 refills | Status: AC

## 2015-10-25 NOTE — Progress Notes (Signed)
Post Partum Day 2 Subjective: "I feel fine and want to go home"   Objective: Blood pressure 124/72, pulse 87, temperature 98.1 F (36.7 C), temperature source Oral, resp. rate 18, height 5\' 7"  (1.702 m), weight 237 lb (107.5 kg), last menstrual period 01/21/2015, SpO2 98 %, unknown if currently breastfeeding.  Physical Exam:  General: A,A&O x 3 Heart: S1S2, RRR, No M/R/G. Lungs: CTA bilat, no W/R/R. Lochia:mod, no clots Uterine Fundus:U-1 DVT Evaluation: Neg Homan's   Recent Labs  10/24/15 0543 10/25/15 0617  HGB 10.5* 9.3*  HCT 31.1* 28.3*  WBC 18.5* 11.4*  PLT 212 226    Assessment/Plan: 1. PPD#2 Stable 2. Anemia P: DC home 2. Fe replacement 3. Disc Micronor at 6 weeks 4. Lacation to continue   LOS: 2 days   Catheryn Bacon 10/25/2015, 8:31 AM

## 2015-10-25 NOTE — Discharge Summary (Signed)
Obstetric Discharge Summary   Patient ID: Sara Gonzales MRN: [id number] DOB/AGE: 33-07-1980 33 y.o.   Date of Admission: 10/22/2015  Date of Discharge: 10/25/15  Admitting Diagnosis: Term pregnancy at [redacted]w[redacted]d  Secondary Diagnosis:Lt sided chest pain of pleuritic nature  Mode of Delivery: NSVD of female infant    Discharge Diagnosis: Term pregnancy with NSVD of viable female infant with Lt sided CP resolving on PPD#1   Intrapartum Procedures: ECHO WNL, EKG: Rt BBB but, not specific for any issues, Pulmonology consult with no diagnostic findings except pleuritic pain, CT angiogram which was neg,    Post partum procedures: Bakri balloon, IV Pitocin, Methergine, Cytotec  Complications: PP hemorrhage necessitating Fe replacement, Lt sided CP with all Cardiac and lung disorders ruled out (Pleuritic pain) unknown cause   [place] is a [id number] who had a SVD on 10/23/15   for further details of this delivery, please refer to the delivery note.  Patient had an complicated Intrapartum/ postpartum period due to Lt sided CP  By time of discharge on PPD#2, her pain was controlled on oral pain medications; she had appropriate lochia and was ambulating, voiding without difficulty and tolerating regular diet.  She was deemed stable for discharge to home.     Labs: CBC Latest Ref Rng & Units 10/25/2015 10/24/2015 10/23/2015  WBC 3.6 - 11.0 K/uL 11.4(H) 18.5(H) 17.3(H)  Hemoglobin 12.0 - 16.0 g/dL 9.3(L) 10.5(L) 11.2(L)  Hematocrit 35.0 - 47.0 % 28.3(L) 31.1(L) 33.6(L)  Platelets 150 - 440 K/uL 226 212 230   A POS  Physical exam:  Blood pressure 124/72, pulse 87, temperature 98.1 F (36.7 C), temperature source Oral, resp. rate 18, height 5\' 7"  (1.702 m), weight 237 lb (107.5 kg), last menstrual period 01/21/2015, SpO2 98 %, unknown if currently breastfeeding. General: alert and no distress Heart:S1S2, RRR, No M/R/G. Lungs: CTA bilat, no W/R/R. Lochia:  appropriate Abdomen: soft, NT Uterine Fundus: firm, U-1 Tol po well, voiding well, up ab lib without any dizziness or lightheadedness Extremities: No evidence of DVT seen on physical exam. No lower extremity edema.  Discharge Instructions: Per After Visit Summary. Activity: Advance as tolerated. Pelvic rest for 6 weeks.  Also refer to After Visit Summary Diet: Regular  Outpatient follow up:  Postpartum contraception: Plans Micronor at 6 weeks pp  Discharged Condition: Stable   Discharged to: home   Newborn Data:  Baby Boy  named "Maverick"  Disposition: Home  Apgars: APGAR (1 MIN): 8   APGAR (5 MINS): 9   APGAR (10 MINS):    Baby Feeding: Breast  Catheryn Bacon, CNM 10/25/2015

## 2015-10-25 NOTE — Discharge Instructions (Signed)
Care After Vaginal Delivery °Congratulations on your new baby!! ° °Refer to this sheet in the next few weeks. These discharge instructions provide you with information on caring for yourself after delivery. Your caregiver may also give you specific instructions. Your treatment has been planned according to the most current medical practices available, but problems sometimes occur. Call your caregiver if you have any problems or questions after you go home. ° °HOME CARE INSTRUCTIONS °· Take over-the-counter or prescription medicines only as directed by your caregiver or pharmacist. °· Do not drink alcohol, especially if you are breastfeeding or taking medicine to relieve pain. °· Do not chew or smoke tobacco. °· Do not use illegal drugs. °· Continue to use good perineal care. Good perineal care includes: °¨ Wiping your perineum from front to back. °¨ Keeping your perineum clean. °· Do not use tampons or douche until your caregiver says it is okay. °· Shower, wash your hair, and take tub baths as directed by your caregiver. °· Wear a well-fitting bra that provides breast support. °· Eat healthy foods. °· Drink enough fluids to keep your urine clear or pale yellow. °· Eat high-fiber foods such as whole grain cereals and breads, brown rice, beans, and fresh fruits and vegetables every day. These foods may help prevent or relieve constipation. °· Follow your caregiver's recommendations regarding resumption of activities such as climbing stairs, driving, lifting, exercising, or traveling. Specifically, no driving for two weeks, so that you are comfortable reacting quickly in an emergency. °· Talk to your caregiver about resuming sexual activities. Resumption of sexual activities is dependent upon your risk of infection, your rate of healing, and your comfort and desire to resume sexual activity. Usually we recommend waiting about six weeks, or until your bleeding stops and you are interested in sex. °· Try to have someone  help you with your household activities and your newborn for at least a few days after you leave the hospital. Even longer is better. °· Rest as much as possible. Try to rest or take a nap when your newborn is sleeping. Sleep deprivation can be very hard after delivery. °· Increase your activities gradually. °· Keep all of your scheduled postpartum appointments. It is very important to keep your scheduled follow-up appointments. At these appointments, your caregiver will be checking to make sure that you are healing physically and emotionally. ° °SEEK MEDICAL CARE IF:  °· You are passing large clots from your vagina.  °· You have a foul smelling discharge from your vagina. °· You have trouble urinating. °· You are urinating frequently. °· You have pain when you urinate. °· You have a change in your bowel movements. °· You have increasing redness, pain, or swelling near your vaginal incision (episiotomy) or vaginal tear. °· You have pus draining from your episiotomy or vaginal tear. °· Your episiotomy or vaginal tear is separating. °· You have painful, hard, or reddened breasts. °· You have a severe headache. °· You have blurred vision or see spots. °· You feel sad or depressed. °· You have thoughts of hurting yourself or your newborn. °· You have questions about your care, the care of your newborn, or medicines. °· You are dizzy or light-headed. °· You have a rash. °· You have nausea or vomiting. °· You were breastfeeding and have not had a menstrual period within 12 weeks after you stopped breastfeeding. °· You are not breastfeeding and have not had a menstrual period by the 12th week after delivery. °· You   have a fever. ° °SEEK IMMEDIATE MEDICAL CARE IF:  °· You have persistent pain. °· You have chest pain. °· You have shortness of breath. °· You faint. °· You have leg pain. °· You have stomach pain. °· Your vaginal bleeding saturates two or more sanitary pads in 1 hour. ° °MAKE SURE YOU:  °· Understand these  instructions. °· Will get help right away if you are not doing well or get worse. °·  °Document Released: 12/19/1999 Document Revised: 05/07/2013 Document Reviewed: 08/18/2011 ° °Exit[employer]® Patient Information ©2015 [employer]. This information is not intended to replace advice given to you by your health care provider. Make sure you discuss any questions you have with your health care provider. ° °

## 2015-10-25 NOTE — Progress Notes (Signed)
Pt discharged home with infant.  Discharge instructions and follow up appointment given to and reviewed with pt.  Pt verbalized understanding.  Escorted by auxillary. 

## 2015-10-27 LAB — SURGICAL PATHOLOGY

## 2015-10-31 ENCOUNTER — Encounter: Payer: Commercial Managed Care - PPO | Admitting: Family Medicine

## 2015-11-04 ENCOUNTER — Encounter: Payer: Commercial Managed Care - PPO | Admitting: Family Medicine

## 2017-05-20 ENCOUNTER — Encounter: Payer: Self-pay | Admitting: Family Medicine

## 2017-05-25 ENCOUNTER — Encounter: Payer: Self-pay | Admitting: Family Medicine

## 2018-09-29 IMAGING — US US EXTREM LOW VENOUS BILAT
1 series · 13 of 24 positions shown · non-contrast
Comparison: None.

CLINICAL DATA: Bilateral lower extremity edema.



[Series 1: us extrem low venous bilat · 0.08mm/px · 13 of 83 slices shown]
[im 1/83]
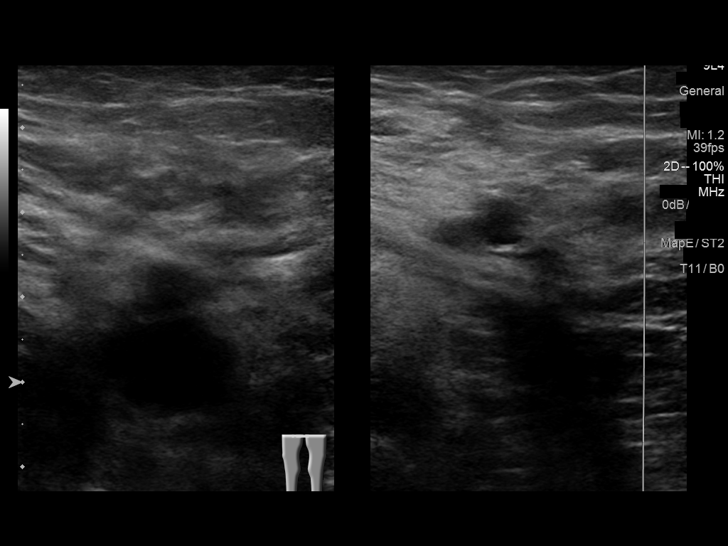
[im 8/83]
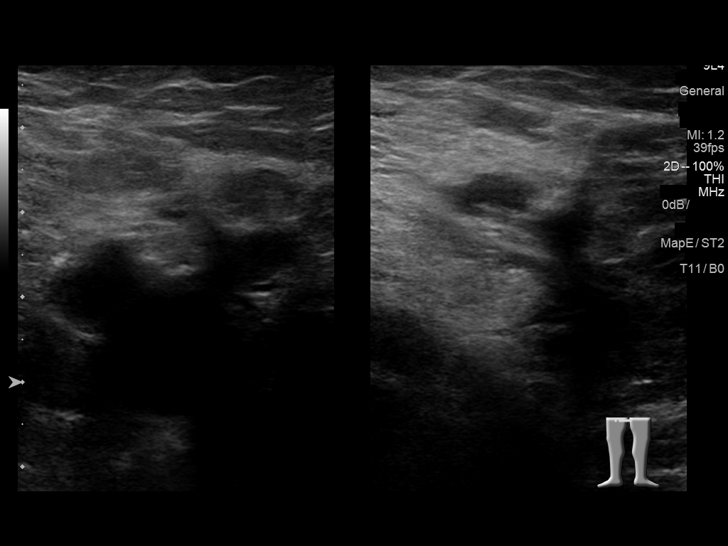
[im 15/83]
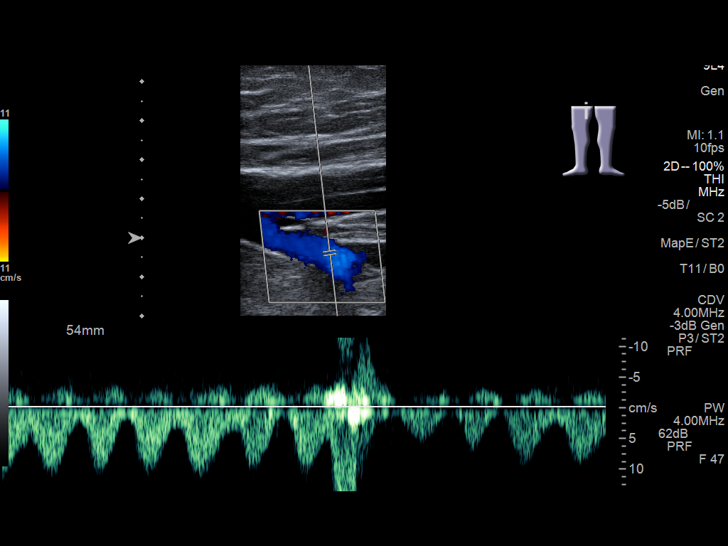
[im 22/83]
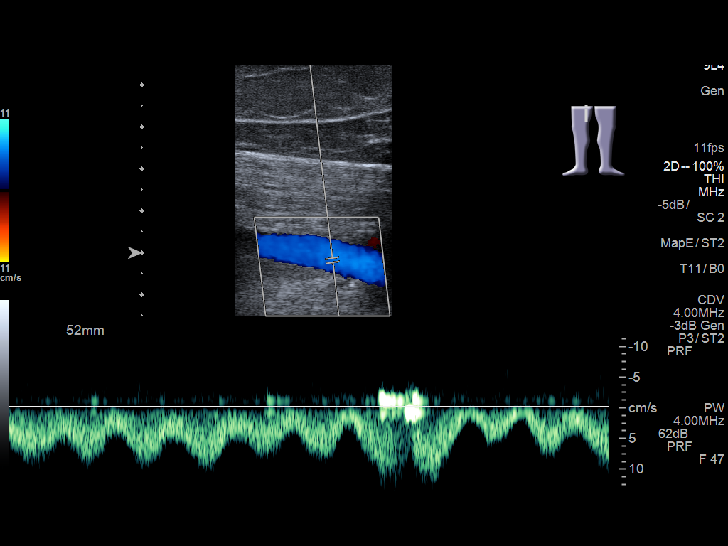
[im 29/83]
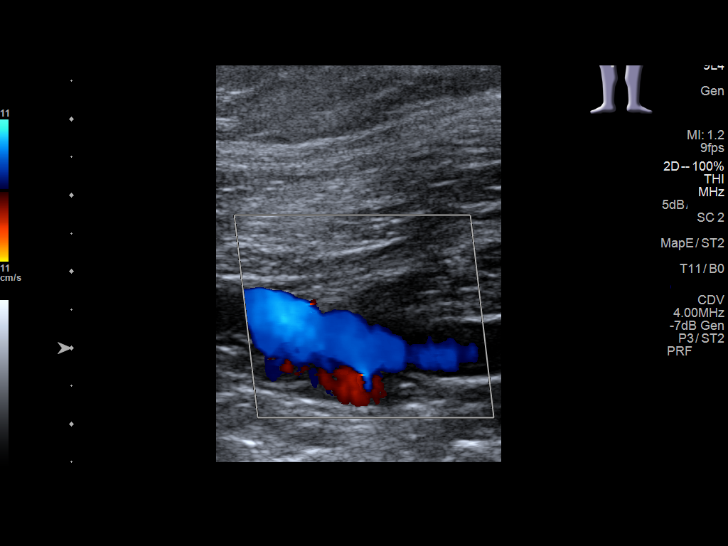
[im 36/83]
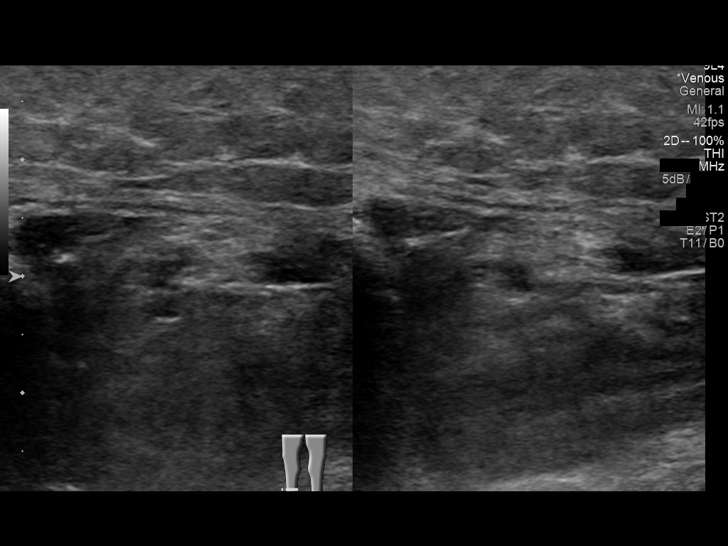
[im 43/83]
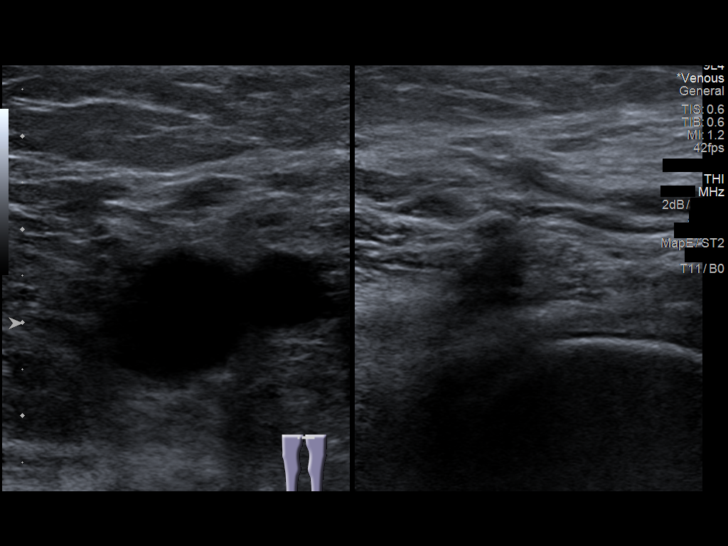
[im 47/83]
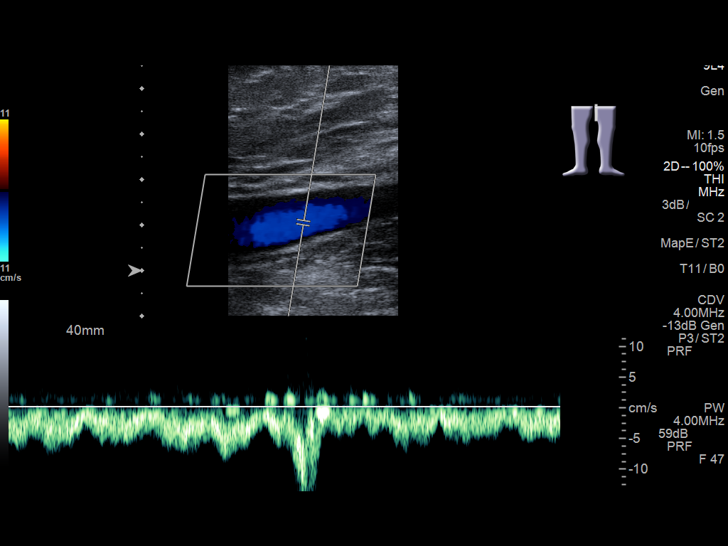
[im 54/83]
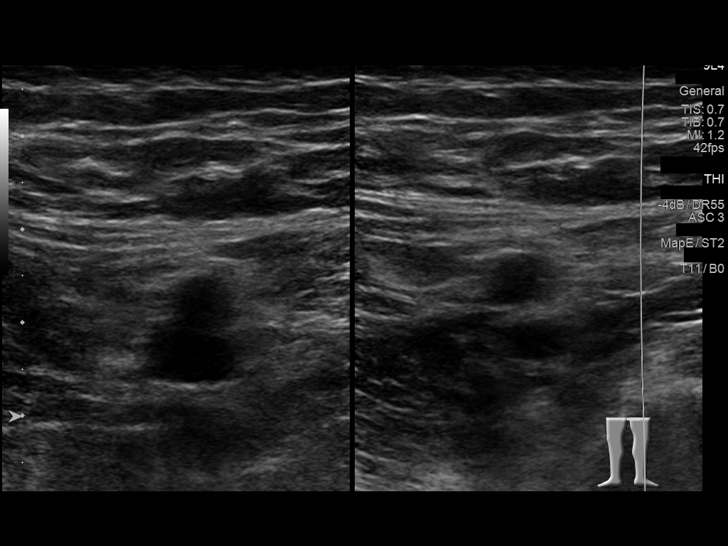
[im 61/83]
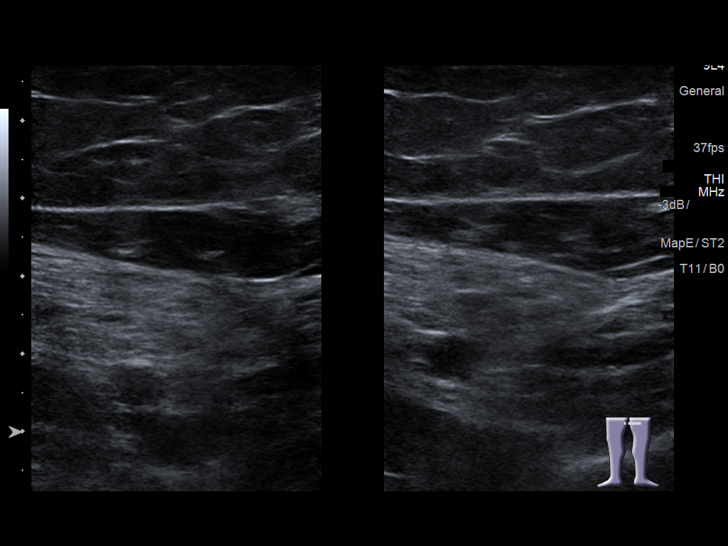
[im 68/83]
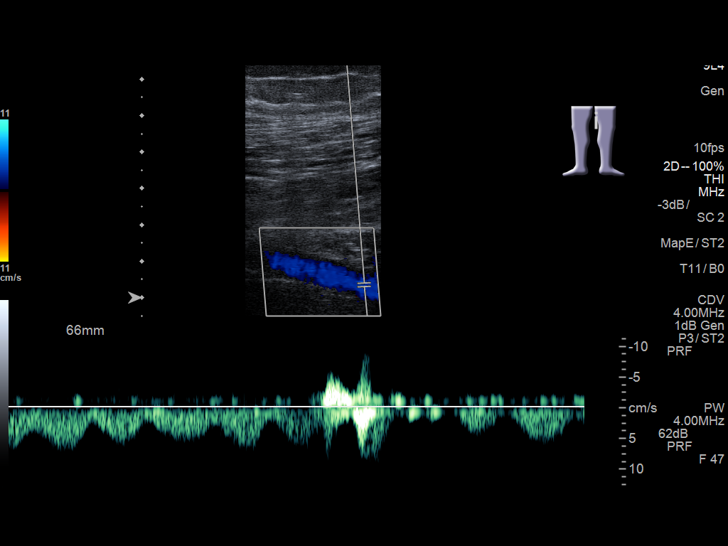
[im 75/83]
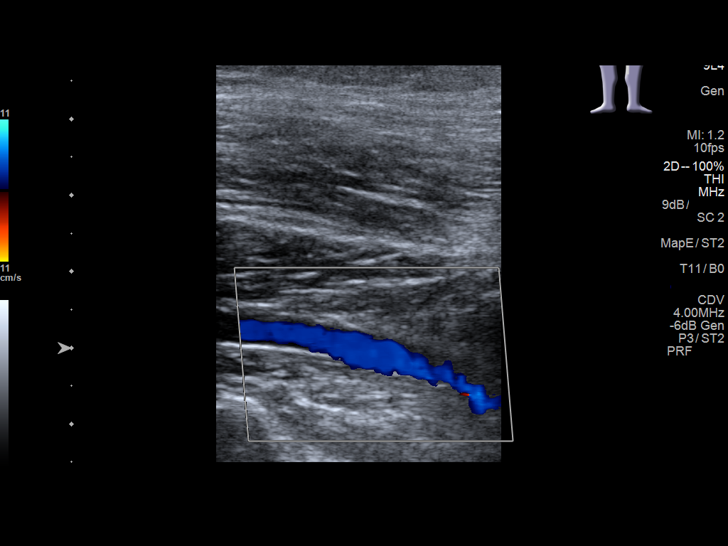
[im 83/83]
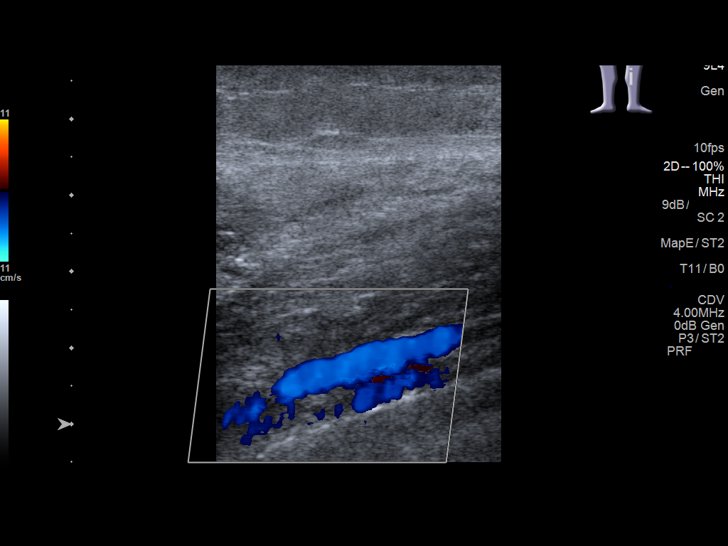

[13 of 24 positions shown; findings below may reference images not displayed]

FINDINGS: RIGHT LOWER EXTREMITY

Common Femoral Vein: No evidence of thrombus. Normal
compressibility, respiratory phasicity and response to augmentation.

Saphenofemoral Junction: No evidence of thrombus. Normal
compressibility and flow on color Doppler imaging.

Profunda Femoral Vein: No evidence of thrombus. Normal
compressibility and flow on color Doppler imaging.

Femoral Vein: No evidence of thrombus. Normal compressibility,
respiratory phasicity and response to augmentation.

Popliteal Vein: No evidence of thrombus. Normal compressibility,
respiratory phasicity and response to augmentation.

Calf Veins: No evidence of thrombus. Normal compressibility and flow
on color Doppler imaging.

LEFT LOWER EXTREMITY

Common Femoral Vein: No evidence of thrombus. Normal
compressibility, respiratory phasicity and response to augmentation.

Saphenofemoral Junction: No evidence of thrombus. Normal
compressibility and flow on color Doppler imaging.

Profunda Femoral Vein: No evidence of thrombus. Normal
compressibility and flow on color Doppler imaging.

Femoral Vein: No evidence of thrombus. Normal compressibility,
respiratory phasicity and response to augmentation.

Popliteal Vein: No evidence of thrombus. Normal compressibility,
respiratory phasicity and response to augmentation.

Calf Veins: No evidence of thrombus. Normal compressibility and flow
on color Doppler imaging.
IMPRESSION: No evidence of deep venous thrombosis.

## 2018-12-04 ENCOUNTER — Other Ambulatory Visit: Payer: Self-pay

## 2018-12-04 ENCOUNTER — Encounter (HOSPITAL_COMMUNITY): Payer: Self-pay

## 2018-12-04 DIAGNOSIS — E669 Obesity, unspecified: Secondary | ICD-10-CM | POA: Diagnosis not present

## 2018-12-04 DIAGNOSIS — N921 Excessive and frequent menstruation with irregular cycle: Secondary | ICD-10-CM | POA: Diagnosis not present

## 2018-12-04 DIAGNOSIS — M25511 Pain in right shoulder: Secondary | ICD-10-CM | POA: Insufficient documentation

## 2018-12-04 DIAGNOSIS — Z79899 Other long term (current) drug therapy: Secondary | ICD-10-CM | POA: Diagnosis not present

## 2018-12-04 DIAGNOSIS — J309 Allergic rhinitis, unspecified: Secondary | ICD-10-CM | POA: Diagnosis not present

## 2018-12-04 DIAGNOSIS — Z6834 Body mass index (BMI) 34.0-34.9, adult: Secondary | ICD-10-CM | POA: Diagnosis not present

## 2018-12-04 DIAGNOSIS — K8020 Calculus of gallbladder without cholecystitis without obstruction: Secondary | ICD-10-CM | POA: Diagnosis present

## 2018-12-04 DIAGNOSIS — Z20828 Contact with and (suspected) exposure to other viral communicable diseases: Secondary | ICD-10-CM | POA: Diagnosis not present

## 2018-12-04 DIAGNOSIS — Z885 Allergy status to narcotic agent status: Secondary | ICD-10-CM | POA: Diagnosis not present

## 2018-12-04 DIAGNOSIS — Z79891 Long term (current) use of opiate analgesic: Secondary | ICD-10-CM | POA: Diagnosis not present

## 2018-12-04 DIAGNOSIS — Z87891 Personal history of nicotine dependence: Secondary | ICD-10-CM | POA: Insufficient documentation

## 2018-12-04 DIAGNOSIS — F329 Major depressive disorder, single episode, unspecified: Secondary | ICD-10-CM | POA: Diagnosis not present

## 2018-12-04 LAB — COMPREHENSIVE METABOLIC PANEL
ALT: 21 U/L (ref 0–44)
AST: 15 U/L (ref 15–41)
Albumin: 3.7 g/dL (ref 3.5–5.0)
Alkaline Phosphatase: 69 U/L (ref 38–126)
Anion gap: 7 (ref 5–15)
BUN: 13 mg/dL (ref 6–20)
CO2: 23 mmol/L (ref 22–32)
Calcium: 8.6 mg/dL — ABNORMAL LOW (ref 8.9–10.3)
Chloride: 108 mmol/L (ref 98–111)
Creatinine, Ser: 0.69 mg/dL (ref 0.44–1.00)
GFR calc Af Amer: >60 mL/min (ref >60)
GFR calc non Af Amer: >60 mL/min (ref >60)
Glucose, Bld: 82 mg/dL (ref 70–99)
Potassium: 3.6 mmol/L (ref 3.5–5.1)
Sodium: 138 mmol/L (ref 135–145)
Total Bilirubin: 0.4 mg/dL (ref 0.3–1.2)
Total Protein: 7.2 g/dL (ref 6.5–8.1)

## 2018-12-04 LAB — URINALYSIS, ROUTINE W REFLEX MICROSCOPIC
Bilirubin Urine: NEGATIVE
Glucose, UA: 50 mg/dL — AB
Hgb urine dipstick: NEGATIVE
Ketones, ur: NEGATIVE mg/dL
Nitrite: NEGATIVE
Protein, ur: NEGATIVE mg/dL
Specific Gravity, Urine: 1.012 (ref 1.005–1.030)
pH: 7 (ref 5.0–8.0)

## 2018-12-04 LAB — CBC
HCT: 39.6 % (ref 36.0–46.0)
Hemoglobin: 13.1 g/dL (ref 12.0–15.0)
MCH: 31 pg (ref 26.0–34.0)
MCHC: 33.1 g/dL (ref 30.0–36.0)
MCV: 93.6 fL (ref 80.0–100.0)
Platelets: 351 10*3/uL (ref 150–400)
RBC: 4.23 MIL/uL (ref 3.87–5.11)
RDW: 12.5 % (ref 11.5–15.5)
WBC: 11.4 10*3/uL — ABNORMAL HIGH (ref 4.0–10.5)
nRBC: 0 % (ref 0.0–0.2)

## 2018-12-04 LAB — LIPASE, BLOOD: Lipase: 30 U/L (ref 11–51)

## 2018-12-04 LAB — I-STAT BETA HCG BLOOD, ED (MC, WL, AP ONLY): I-stat hCG, quantitative: <5 m[IU]/mL (ref <5)

## 2018-12-04 NOTE — ED Triage Notes (Signed)
Arrived POV, diagnosed with gallstones on Friday. Patient reports she left work today in tears because the pain was so bad. Patient reports med upper back pain.

## 2018-12-05 ENCOUNTER — Encounter (HOSPITAL_COMMUNITY): Payer: Self-pay | Admitting: General Surgery

## 2018-12-05 ENCOUNTER — Emergency Department (HOSPITAL_COMMUNITY)

## 2018-12-05 ENCOUNTER — Emergency Department (HOSPITAL_COMMUNITY)
Admission: EM | Admit: 2018-12-05 | Discharge: 2018-12-05 | Disposition: A | Discharge status: Home / Self Care | Source: Home / Self Care | Attending: Emergency Medicine | Admitting: Emergency Medicine

## 2018-12-05 DIAGNOSIS — K8020 Calculus of gallbladder without cholecystitis without obstruction: Secondary | ICD-10-CM

## 2018-12-05 MED ORDER — FENTANYL CITRATE (PF) 100 MCG/2ML IJ SOLN
50.0000 ug | Freq: Once | INTRAMUSCULAR | Status: AC
  Administered 2018-12-05: 50 ug via INTRAVENOUS
  Filled 2018-12-05: qty 2

## 2018-12-05 MED ORDER — METOCLOPRAMIDE HCL 5 MG/ML IJ SOLN
10.0000 mg | Freq: Once | INTRAMUSCULAR | Status: AC
  Administered 2018-12-05: 10 mg via INTRAVENOUS
  Filled 2018-12-05: qty 2

## 2018-12-05 MED ORDER — OXYCODONE-ACETAMINOPHEN 5-325 MG PO TABS
1.0000 | ORAL_TABLET | Freq: Once | ORAL | Status: AC
  Administered 2018-12-05: 1 via ORAL
  Filled 2018-12-05: qty 1

## 2018-12-05 MED ORDER — HYDROCODONE-ACETAMINOPHEN 5-325 MG PO TABS: 1.0000 | ORAL_TABLET | ORAL | 0 refills | Status: DC | PRN

## 2018-12-05 MED ORDER — FAMOTIDINE IN NACL 20-0.9 MG/50ML-% IV SOLN
20.0000 mg | Freq: Once | INTRAVENOUS | Status: AC
  Administered 2018-12-05: 20 mg via INTRAVENOUS
  Filled 2018-12-05: qty 50

## 2018-12-05 NOTE — ED Notes (Signed)
Pt ambulatory to restroom without concerns

## 2018-12-05 NOTE — Consult Note (Addendum)
Sara Gonzales [birth year]  [id number].    Requesting MD: Dr. Joseph Berkshire Chief Complaint/Reason for Consult: biliary colic  HPI:  This is a 38 yo white female with no significant PMH who developed acute RUQ abdominal pain that radiated to her back on Friday.  She had some nausea, but no emesis at that time.  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  A referral was made at that time to our office.  She was given tramadol over the weekend and has improved.  However, yesterday she began having the same type pain, but with no nausea or emesis this time.  She denies any fevers, chills, SOB, chest pain, cough, dysuria, diarrhea, etc.  She was referred to the [facility].  Upon arrival, she has been noted to have essentially normal labs (WBC mildly elevated at 11.7), AF, and an Korea that once again reveals gallstones, but no wall thickening, pericholecystic fluid, or evidence of cholecystitis.  We have been asked to see her to discuss a further plan of care.  In the interim she has been food challenged with water and graham crackers which she has tolerated along with a percocet which has improved her pain.  ROS: ROS: Please see HPI, otherwise all other systems have been reviewed and are negative.  Family History  Problem Relation Age of Onset  . Hypotension Mother        also hypoglycemia  . Migraines Mother   . Diabetes Father   . Kidney disease Father   . Diabetes Brother        type I  . Mental illness Brother        mental disorder; mother with custody.  . Kidney failure Brother     Past Medical History:  Diagnosis Date  . Abnormal blood chemistry   . Allergy   . AR (allergic rhinitis)   . Depression   . Female infertility   . Goiter   . Irregular menses    chronic  . Knee pain, bilateral   . Neoplasm    benign skin  . Spontaneous miscarriage   . Tobacco use disorder   . Viral warts     Past Surgical  History:  Procedure Laterality Date  . tubes in ear      Social History:  reports that she has quit smoking. She has a 14.00 pack-year smoking history. She has never used smokeless tobacco. She reports current alcohol use. She reports that she does not use drugs.  Allergies:  Allergies  Allergen Reactions  . Codeine Rash    (Not in a hospital admission)    Physical Exam: Blood pressure 119/68, pulse 82, temperature 98.3 F (36.8 C), temperature source Oral, resp. rate 16, height 5\' 7"  (1.702 m), weight 98.4 kg, SpO2 99 %, unknown if currently breastfeeding. General: pleasant, mildly obese white female who is laying in bed in NAD HEENT: head is normocephalic, atraumatic.  Sclera are noninjected.  PERRL.  Ears and nose without any masses or lesions.  Mouth is pink and moist Heart: regular, rate, and rhythm.  Normal s1,s2. No obvious murmurs, gallops, or rubs noted.  Palpable radial and pedal pulses bilaterally Lungs: CTAB, no wheezes, rhonchi, or rales noted.  Respiratory effort nonlabored Abd: soft, minimal discomfort in RUQ, ND, +BS, no masses, hernias, or organomegaly MS: all 4 extremities are symmetrical with no cyanosis, clubbing, or edema. Skin: warm and dry  with no masses, lesions, or rashes Psych: A&Ox3 with an appropriate affect.   Results for orders placed or performed during the hospital encounter of 12/05/18 (from the past 48 hour(s))  Lipase, blood     Status: None   Collection Time: 12/04/18  9:10 PM  Result Value Ref Range   Lipase 30 11 - 51 U/L    Comment: Performed at [facility], [place] [address], [place], [place] [address]  Comprehensive metabolic panel     Status: Abnormal   Collection Time: 12/04/18  9:10 PM  Result Value Ref Range   Sodium 138 135 - 145 mmol/L   Potassium 3.6 3.5 - 5.1 mmol/L   Chloride 108 98 - 111 mmol/L   CO2 23 22 - 32 mmol/L   Glucose, Bld 82 70 - 99 mg/dL   BUN 13 6 - 20 mg/dL   Creatinine, Ser 0.69 0.44 - 1.00  mg/dL   Calcium 8.6 (L) 8.9 - 10.3 mg/dL   Total Protein 7.2 6.5 - 8.1 g/dL   Albumin 3.7 3.5 - 5.0 g/dL   AST 15 15 - 41 U/L   ALT 21 0 - 44 U/L   Alkaline Phosphatase 69 38 - 126 U/L   Total Bilirubin 0.4 0.3 - 1.2 mg/dL   GFR calc non Af Amer >60 >60 mL/min   GFR calc Af Amer >60 >60 mL/min   Anion gap 7 5 - 15    Comment: Performed at [facility], [place] [address] [place], [place] [address]  CBC     Status: Abnormal   Collection Time: 12/04/18  9:10 PM  Result Value Ref Range   WBC 11.4 (H) 4.0 - 10.5 K/uL   RBC 4.23 3.87 - 5.11 MIL/uL   Hemoglobin 13.1 12.0 - 15.0 g/dL   HCT 39.6 36.0 - 46.0 %   MCV 93.6 80.0 - 100.0 fL   MCH 31.0 26.0 - 34.0 pg   MCHC 33.1 30.0 - 36.0 g/dL   RDW 12.5 11.5 - 15.5 %   Platelets 351 150 - 400 K/uL   nRBC 0.0 0.0 - 0.2 %    Comment: Performed at [facility], [place] [address], [place], [place] [address]  Urinalysis, Routine w reflex microscopic     Status: Abnormal   Collection Time: 12/04/18  9:10 PM  Result Value Ref Range   Color, Urine YELLOW YELLOW   APPearance CLEAR CLEAR   Specific Gravity, Urine 1.012 1.005 - 1.030   pH 7.0 5.0 - 8.0   Glucose, UA 50 (A) NEGATIVE mg/dL   Hgb urine dipstick NEGATIVE NEGATIVE   Bilirubin Urine NEGATIVE NEGATIVE   Ketones, ur NEGATIVE NEGATIVE mg/dL   Protein, ur NEGATIVE NEGATIVE mg/dL   Nitrite NEGATIVE NEGATIVE   Leukocytes,Ua SMALL (A) NEGATIVE   RBC / HPF 0-5 0 - 5 RBC/hpf   WBC, UA 0-5 0 - 5 WBC/hpf   Bacteria, UA RARE (A) NONE SEEN   Squamous Epithelial / LPF 0-5 0 - 5   Mucus PRESENT     Comment: Performed at [facility], [place] [address], [place], [place] [address]  I-Stat beta hCG blood, ED     Status: None   Collection Time: 12/04/18  9:18 PM  Result Value Ref Range   I-stat hCG, quantitative <5.0 <5 mIU/mL   Comment 3            Comment:   GEST. AGE      CONC.  (  mIU/mL)   <=1 WEEK        5 - 50     2 WEEKS       50 - 500      3 WEEKS       100 - 10,000     4 WEEKS     1,000 - 30,000        FEMALE AND NON-PREGNANT FEMALE:     LESS THAN 5 mIU/mL    US Abdomen Limited  Result Date: 12/05/2018 CLINICAL DATA:  Right shoulder pain EXAM: ULTRASOUND ABDOMEN LIMITED RIGHT UPPER QUADRANT COMPARISON:  None. FINDINGS: Gallbladder: Layering calcified gallstone is present measuring 1.6 cm. No gallbladder wall thickening. No sonographic Murphy sign noted by sonographer. Common bile duct: Diameter: 2.9 mm Liver: No focal lesion identified. Within normal limits in parenchymal echogenicity. Portal vein is patent on color Doppler imaging with normal direction of blood flow towards the liver. Other: None. IMPRESSION: Cholelithiasis without evidence of acute cholecystitis. Electronically Signed   By: Prudencio Pair M.D.   On: 12/05/2018 03:04      Assessment/Plan Biliary colic  The patient is clearly having biliary colic.  She does not have any signs of acute cholecystitis at this time.  I had a long discussion with her and her husband who was present and explained that she did not have an acute infection and that removal of her gallbladder was not an emergency.  However, given her frequent attacks over the last 4-5 days, that we could admit her to proceed with cholecystectomy.  We did discuss though that we could not proceed with surgery today due to cases already posted.  We discussed the earliest would be tomorrow; however, this could not be guaranteed as she would essentially be the least urgent and so could get bumped until even the following day.  We discussed discharge since her pain is controlled and she has tolerated some food with first available follow up to try and get her seen and set up for an elective lap chole ASAP.  We discussed that I could not guarantee that if she chose that route that she may not have another attack in the interim that brings her back to the ED (at which time, she would likely just require admission at that  time).  She discussed with her husband and both agreed to discharge and plan for urgent follow up to have cholecystectomy arranged.  We discussed the need to return to the ED if her symptoms were to return and unable to be controlled at home.  I have sent a message to our office to call her to schedule an appointment.  I also encouraged the patient to call our office as well to follow up on her referral and my request.  She understands and agrees to this plan.  She is encouraged to continue on her low fat bland diet that she has been on since last week.   Henreitta Cea, [facility] 12/05/2018, 9:01 AM Please see Amion for pager number during day hours 7:00am-4:30pm  Agree with above.  Alphonsa Overall, MD, [facility] Office phone:  [phone]

## 2018-12-05 NOTE — ED Provider Notes (Signed)
38 year old female with complaint of right mid back pain onset November 27 while at work, states had an ultrasound at that time showing gallstones, has been taking Ultram through the weekend which helps with her pain however she is now unable to take the Ultram because she has to go to work.  Denies vomiting or fevers.  Assumed care of patient at change of shift, see prior note for complete history and physical.  Case discussed with Dr. Redmond Pulling with general surgery.  Plan was to reassess after p.o. challenge. Physical Exam  BP 102/67   Pulse 73   Temp 98.3 F (36.8 C) (Oral)   Resp 16   Ht 5\' 7"  (1.702 m)   Wt 98.4 kg   SpO2 97%   BMI 33.99 kg/m   Physical Exam  ED Course/Procedures     Procedures  MDM  Patient tolerating p.o.'s (water and crackers), states vomiting has never been a problem for her, remains afebrile, states she has ongoing pain, came to the ER in hopes of having her gallbladder removed.  Patient was referred to [facility] by her PCP, does not have an appointment that she is aware of at this time.  Case discussed with Saverio Danker, PA-C with general surgery who will see the patient.  Patient seen by general surgery, patient would like to go home and schedule follow up with [facility]. Given Norco for pain, take either Norco or Ultram, return precautions given.    Tacy Learn, PA-C 12/05/18 [id number]    Lucrezia Starch, MD 12/06/18 [phone]

## 2018-12-05 NOTE — ED Provider Notes (Signed)
[place] DEPT Provider Note   CSN: [id number] Arrival date & time: 12/04/18  2019     History   Chief Complaint Chief Complaint  Patient presents with  . Cholelithiasis    HPI Sara Gonzales is a 38 y.o. female.     38 year old female with history of metrorrhagia, depression presents to the emergency department for evaluation of right mid back pain.  She states that she has been experiencing pain under her right scapula.  This began suddenly on Friday.  She was evaluated by primary care and sent for abdominal ultrasound which showed impacted gallstone at the gallbladder neck with additional mobile stones within the gallbladder.  She was placed on a bland diet and was prescribed tramadol as well as Zofran for symptom management.  States that her pain never fully resolved, but did lessen.  She returned to work today with worsening pain.  Symptoms have been unrelieved with ibuprofen.  Is slightly improved when applying pressure to her back against a wall.  Denies any pleuritic nature to her discomfort.  It is not specifically aggravated with eating.  No fevers, hematemesis, melena, hematochezia, urinary symptoms.  No history of abdominal surgeries.  The history is provided by the patient. No language interpreter was used.    Past Medical History:  Diagnosis Date  . Abnormal blood chemistry   . Allergy   . AR (allergic rhinitis)   . Depression   . Female infertility   . Goiter   . Irregular menses    chronic  . Knee pain, bilateral   . Neoplasm    benign skin  . Spontaneous miscarriage   . Tobacco use disorder   . Viral warts     Patient Active Problem List   Diagnosis Date Noted  . Acute chest pain 10/23/2015  . Supervision of normal pregnancy in third trimester 10/23/2015  . Pregnancy 07/14/2015  . Encounter for supervision of other normal pregnancy 07/14/2015  . Routine general medical examination at a health care facility 06/07/2012   . Routine gynecological examination 06/07/2012    Past Surgical History:  Procedure Laterality Date  . tubes in ear       OB History    Gravida  3   Para  2   Term  2   Preterm      AB  1   Living  2     SAB  1   TAB      Ectopic      Multiple  0   Live Births  2            Home Medications    Prior to Admission medications   Medication Sig Start Date End Date Taking? Authorizing Provider  ondansetron (ZOFRAN-ODT) 4 MG disintegrating tablet Take 4 mg by mouth every 8 (eight) hours as needed for nausea or vomiting.  12/01/18  Yes [provider]  traMADol (ULTRAM) 50 MG tablet Take 50 mg by mouth every 6 (six) hours as needed for moderate pain or severe pain.  12/01/18  Yes [provider]  Ferrous Fumarate (HEMOCYTE - 106 MG FE) 324 (106 Fe) MG TABS tablet Take 1 tablet (106 mg of iron total) by mouth 2 (two) times daily. Patient not taking: Reported on 12/05/2018 10/25/15   Catheryn Bacon, CNM    Family History Family History  Problem Relation Age of Onset  . Hypotension Mother        also hypoglycemia  .  Migraines Mother   . Diabetes Father   . Kidney disease Father   . Diabetes Brother        type I  . Mental illness Brother        mental disorder; mother with custody.  . Kidney failure Brother     Social History Social History   Tobacco Use  . Smoking status: Former Smoker    Packs/day: 1.00    Years: 14.00    Pack years: 14.00  . Smokeless tobacco: Never Used  Substance Use Topics  . Alcohol use: Yes    Comment: socially  . Drug use: No     Allergies   Codeine   Review of Systems Review of Systems Ten systems reviewed and are negative for acute change, except as noted in the HPI.    Physical Exam Updated Vital Signs BP 112/66   Pulse 64   Temp 98.3 F (36.8 C) (Oral)   Resp 12   Ht 5\' 7"  (1.702 m)   Wt 98.4 kg   SpO2 97%   BMI 33.99 kg/m   Physical Exam Vitals signs and nursing note reviewed.   Constitutional:      General: She is not in acute distress.    Appearance: She is well-developed. She is not diaphoretic.     Comments: Nontoxic appearing and in NAD  HENT:     Head: Normocephalic and atraumatic.  Eyes:     General: No scleral icterus.    Conjunctiva/sclera: Conjunctivae normal.  Neck:     Musculoskeletal: Normal range of motion.  Cardiovascular:     Rate and Rhythm: Normal rate and regular rhythm.     Pulses: Normal pulses.  Pulmonary:     Effort: Pulmonary effort is normal. No respiratory distress.     Comments: Respirations even and unlabored Abdominal:     Comments: Abdomen soft, obese, nondistended. No TTP in the RUQ; negative Murphy's sign. No peritoneal signs or palpable masses.  Musculoskeletal: Normal range of motion.     Comments: No reproducible TTP to the right paraspinal muscles.  Skin:    General: Skin is warm and dry.     Coloration: Skin is not pale.     Findings: No erythema or rash.  Neurological:     Mental Status: She is alert and oriented to person, place, and time.     Coordination: Coordination normal.  Psychiatric:        Behavior: Behavior normal.      ED Treatments / Results  Labs (all labs ordered are listed, but only abnormal results are displayed) Labs Reviewed  COMPREHENSIVE METABOLIC PANEL - Abnormal; Notable for the following components:      Result Value   Calcium 8.6 (*)    All other components within normal limits  CBC - Abnormal; Notable for the following components:   WBC 11.4 (*)    All other components within normal limits  URINALYSIS, ROUTINE W REFLEX MICROSCOPIC - Abnormal; Notable for the following components:   Glucose, UA 50 (*)    Leukocytes,Ua SMALL (*)    Bacteria, UA RARE (*)    All other components within normal limits  SARS CORONAVIRUS 2 (TAT 6-24 HRS)  LIPASE, BLOOD  I-STAT BETA HCG BLOOD, ED ([facility], [facility], [facility] ONLY)    EKG None  Radiology US Abdomen Limited  Result Date: 12/05/2018 CLINICAL  DATA:  Right shoulder pain EXAM: ULTRASOUND ABDOMEN LIMITED RIGHT UPPER QUADRANT COMPARISON:  None. FINDINGS: Gallbladder: Layering calcified gallstone is present  measuring 1.6 cm. No gallbladder wall thickening. No sonographic Murphy sign noted by sonographer. Common bile duct: Diameter: 2.9 mm Liver: No focal lesion identified. Within normal limits in parenchymal echogenicity. Portal vein is patent on color Doppler imaging with normal direction of blood flow towards the liver. Other: None. IMPRESSION: Cholelithiasis without evidence of acute cholecystitis. Electronically Signed   By: Prudencio Pair M.D.   On: 12/05/2018 03:04     IMPRESSION: Cholelithiasis with positive sonographic Murphy sign. No other findings to suggest cholecystitis. No biliary dilatation.  Electronically Signed by: Ruta Hinds  Result Narrative  TECHNIQUE: Realtime multiplanar grayscale and limited color Doppler ultrasound of the right upper quadrant was performed. COMPARISON: None.  INDICATION: Right upper quadrant pain  FINDINGS: Cholelithiasis with positive sonographic Murphy sign. 1.6 cm stone impacted in the gallbladder neck and 1.1 cm mobile stone. No wall thickening or pericholecystic fluid.  Normal liver, biliary tree, pancreas, aorta, and IVC. The portal vein is patent with hepatopedal flow documented by duplex color with spectral analysis.. Right kidney is remarkable for an extrarenal pelvis.  Other Result Information  Acute Interface, Incoming Rad Results - 12/01/2018 10:49 AM EST TECHNIQUE: Realtime multiplanar grayscale and limited color Doppler ultrasound of the right upper quadrant was performed. COMPARISON: None.   INDICATION: Right upper quadrant pain  FINDINGS: Cholelithiasis with positive sonographic Murphy sign. 1.6 cm stone impacted in the gallbladder neck and 1.1 cm mobile stone. No wall thickening or pericholecystic fluid.  Normal liver, biliary tree, pancreas, aorta, and IVC. The portal vein is  patent with hepatopedal flow documented by duplex color with spectral analysis.. Right kidney is remarkable for an extrarenal pelvis.   IMPRESSION: Cholelithiasis with positive sonographic Murphy sign. No other findings to suggest cholecystitis. No biliary dilatation.  Electronically Signed by: Ruta Hinds    Procedures Procedures (including critical care time)  Medications Ordered in ED Medications  metoCLOPramide (REGLAN) injection 10 mg (10 mg Intravenous Given 12/05/18 0157)  famotidine (PEPCID) IVPB 20 mg premix (0 mg Intravenous Stopped 12/05/18 0240)  fentaNYL (SUBLIMAZE) injection 50 mcg (50 mcg Intravenous Given 12/05/18 0200)     Initial Impression / Assessment and Plan / ED Course  I have reviewed the triage vital signs and the nursing notes.  Pertinent labs & imaging results that were available during my care of the patient were reviewed by me and considered in my medical decision making (see chart for details).        4:107 AM 38 year old female presenting for persistent right-sided abdominal and back pain.  Was diagnosed with a gallstone on 12/01/2018.  Has been having constant pain which worsened while at work.  Previously taking tramadol, but did not utilize this medication while working.  Had no improvement in pain with ibuprofen.  LFTs preserved without leukocytosis.  Plan for repeat ultrasound to assess for any changes such as pericholecystic fluid or gallbladder wall thickening.  5:15 AM Ultrasound with cholelithiasis without evidence of acute cholecystitis.  Pain has improved with fentanyl, but remains constant and mild.  Patient expresses concern with discharge given consistency of pain since Thursday.  Wishes to discuss more urgent/emergent management with general surgery.  Consult placed to [facility].  5:58 AM Case discussed with Dr. Redmond Pulling of [place].  Recommends attempt at outpatient follow-up as surgical service is booked for the OR today and the soonest the patient  would be able to have surgery is tomorrow.  Will oral fluid challenge.  Dr. Redmond Pulling recommends repeat consultation if patient unable to tolerate  PO or pain severity recurs.  6:18 AM Patient updated on plan for oral fluid challenge and pain control.  Care signed out to [facility], PA-C at change of shift.   Final Clinical Impressions(s) / ED Diagnoses   Final diagnoses:  Symptomatic cholelithiasis    ED Discharge Orders    None       Antonietta Breach, PA-C 12/05/18 [id number]    Orpah Greek, MD 12/05/18 [phone]

## 2018-12-05 NOTE — ED Notes (Signed)
Pt provided water to start PO challenge

## 2018-12-05 NOTE — Discharge Instructions (Signed)
Call [facility] surgery today to schedule follow-up. Take either the Norco or the Ultram for your pain. Take Colace to help keep stool soft and prevent constipation. Take Zofran as needed as prescribed for nausea and vomiting. Return to ER for worsening pain, fever, uncontrolled vomiting.

## 2018-12-06 ENCOUNTER — Encounter (HOSPITAL_COMMUNITY): Payer: Self-pay

## 2018-12-06 ENCOUNTER — Other Ambulatory Visit: Payer: Self-pay

## 2018-12-06 ENCOUNTER — Inpatient Hospital Stay (HOSPITAL_COMMUNITY)
Admission: AD | Admit: 2018-12-06 | Discharge: 2018-12-08 | DRG: 419 | Disposition: A | Discharge status: Home / Self Care | Source: Ambulatory Visit | Attending: General Surgery | Admitting: General Surgery

## 2018-12-06 DIAGNOSIS — E669 Obesity, unspecified: Secondary | ICD-10-CM | POA: Diagnosis present

## 2018-12-06 DIAGNOSIS — K8050 Calculus of bile duct without cholangitis or cholecystitis without obstruction: Secondary | ICD-10-CM | POA: Diagnosis present

## 2018-12-06 DIAGNOSIS — Z885 Allergy status to narcotic agent status: Secondary | ICD-10-CM

## 2018-12-06 DIAGNOSIS — Z79891 Long term (current) use of opiate analgesic: Secondary | ICD-10-CM

## 2018-12-06 DIAGNOSIS — Z20828 Contact with and (suspected) exposure to other viral communicable diseases: Secondary | ICD-10-CM | POA: Diagnosis present

## 2018-12-06 DIAGNOSIS — Z6834 Body mass index (BMI) 34.0-34.9, adult: Secondary | ICD-10-CM

## 2018-12-06 DIAGNOSIS — F329 Major depressive disorder, single episode, unspecified: Secondary | ICD-10-CM | POA: Diagnosis present

## 2018-12-06 DIAGNOSIS — N921 Excessive and frequent menstruation with irregular cycle: Secondary | ICD-10-CM | POA: Diagnosis present

## 2018-12-06 DIAGNOSIS — Z79899 Other long term (current) drug therapy: Secondary | ICD-10-CM

## 2018-12-06 DIAGNOSIS — J309 Allergic rhinitis, unspecified: Secondary | ICD-10-CM | POA: Diagnosis present

## 2018-12-06 DIAGNOSIS — K8020 Calculus of gallbladder without cholecystitis without obstruction: Principal | ICD-10-CM | POA: Diagnosis present

## 2018-12-06 DIAGNOSIS — Z87891 Personal history of nicotine dependence: Secondary | ICD-10-CM

## 2018-12-06 LAB — SURGICAL PCR SCREEN
MRSA, PCR: NEGATIVE
Staphylococcus aureus: NEGATIVE

## 2018-12-06 LAB — CBC
HCT: 39.9 % (ref 36.0–46.0)
Hemoglobin: 13.4 g/dL (ref 12.0–15.0)
MCH: 31.4 pg (ref 26.0–34.0)
MCHC: 33.6 g/dL (ref 30.0–36.0)
MCV: 93.4 fL (ref 80.0–100.0)
Platelets: 320 10*3/uL (ref 150–400)
RBC: 4.27 MIL/uL (ref 3.87–5.11)
RDW: 12.5 % (ref 11.5–15.5)
WBC: 11.3 10*3/uL — ABNORMAL HIGH (ref 4.0–10.5)
nRBC: 0 % (ref 0.0–0.2)

## 2018-12-06 LAB — CREATININE, SERUM
Creatinine, Ser: 0.7 mg/dL (ref 0.44–1.00)
GFR calc Af Amer: >60 mL/min (ref >60)
GFR calc non Af Amer: >60 mL/min (ref >60)

## 2018-12-06 LAB — SARS CORONAVIRUS 2 BY RT PCR (HOSPITAL ORDER, PERFORMED IN ~~LOC~~ HOSPITAL LAB): SARS Coronavirus 2: NEGATIVE

## 2018-12-06 LAB — HIV ANTIBODY (ROUTINE TESTING W REFLEX): HIV Screen 4th Generation wRfx: NONREACTIVE

## 2018-12-06 MED ORDER — SODIUM CHLORIDE 0.9 % IV SOLN
2.0000 g | INTRAVENOUS | Status: DC
  Administered 2018-12-06 – 2018-12-07 (×2): 2 g via INTRAVENOUS
  Filled 2018-12-06 (×2): qty 2

## 2018-12-06 MED ORDER — DIPHENHYDRAMINE HCL 25 MG PO CAPS: 25.0000 mg | ORAL_CAPSULE | Freq: Four times a day (QID) | ORAL | Status: DC | PRN

## 2018-12-06 MED ORDER — ACETAMINOPHEN 325 MG PO TABS: 650.0000 mg | ORAL_TABLET | Freq: Four times a day (QID) | ORAL | Status: DC | PRN

## 2018-12-06 MED ORDER — HEPARIN SODIUM (PORCINE) 5000 UNIT/ML IJ SOLN
5000.0000 [IU] | Freq: Three times a day (TID) | INTRAMUSCULAR | Status: AC
  Administered 2018-12-06: 5000 [IU] via SUBCUTANEOUS
  Filled 2018-12-06: qty 1

## 2018-12-06 MED ORDER — MUPIROCIN 2 % EX OINT
1.0000 "application " | TOPICAL_OINTMENT | Freq: Two times a day (BID) | CUTANEOUS | Status: DC
  Administered 2018-12-07: 1 via NASAL
  Filled 2018-12-06: qty 22

## 2018-12-06 MED ORDER — OXYCODONE-ACETAMINOPHEN 5-325 MG PO TABS
1.0000 | ORAL_TABLET | ORAL | Status: DC | PRN
  Administered 2018-12-06: 1 via ORAL
  Filled 2018-12-06: qty 1

## 2018-12-06 MED ORDER — ACETAMINOPHEN 650 MG RE SUPP: 650.0000 mg | Freq: Four times a day (QID) | RECTAL | Status: DC | PRN

## 2018-12-06 MED ORDER — DIPHENHYDRAMINE HCL 50 MG/ML IJ SOLN: 25.0000 mg | Freq: Four times a day (QID) | INTRAMUSCULAR | Status: DC | PRN

## 2018-12-06 MED ORDER — HYDROMORPHONE HCL 1 MG/ML IJ SOLN
0.5000 mg | INTRAMUSCULAR | Status: DC | PRN
  Administered 2018-12-08: 0.5 mg via INTRAVENOUS

## 2018-12-06 MED ORDER — ONDANSETRON HCL 4 MG/2ML IJ SOLN
4.0000 mg | Freq: Four times a day (QID) | INTRAMUSCULAR | Status: DC | PRN
  Administered 2018-12-06 – 2018-12-08 (×3): 4 mg via INTRAVENOUS
  Filled 2018-12-06 (×2): qty 2

## 2018-12-06 MED ORDER — KCL IN DEXTROSE-NACL 20-5-0.9 MEQ/L-%-% IV SOLN
INTRAVENOUS | Status: DC
  Administered 2018-12-06 – 2018-12-07 (×3): via INTRAVENOUS
  Filled 2018-12-06 (×4): qty 1000

## 2018-12-06 MED ORDER — ONDANSETRON 4 MG PO TBDP: 4.0000 mg | ORAL_TABLET | Freq: Four times a day (QID) | ORAL | Status: DC | PRN

## 2018-12-06 NOTE — H&P (Addendum)
[facility] Surgery Consult/Admission Note  Sara Gonzales October 20, 1980  [id number].     Chief Complaint/Reason for Consult: biliary cholic, direct admit  HPI:    Pt is a 38 yo white female with no significant PMH who developed acute RUQ abdominal pain that radiated to her back on Friday.  Associated nausea no emesis.  She had a [place] which revealed a gallstone but no evidence of cholecystitis.  Three days ago she began having the same type pain, but with no nausea or emesis this time.    She presented to the ED yesterday but pain improved and she was sent home. She was seen in our office today and sent to [facility] as a direct admit by Dr. Dema Severin.  [place] yesterday revealed gallstones, but no wall thickening, pericholecystic fluid, or evidence of cholecystitis.  Pt is having continued pain without associated symptoms. She denies any fevers, chills, SOB, chest pain, cough, dysuria, diarrhea, etc.   ROS:  Review of Systems  Constitutional: Negative for chills, diaphoresis and fever.  HENT: Negative for sore throat.   Respiratory: Negative for cough and shortness of breath.   Cardiovascular: Negative for chest pain.  Gastrointestinal: Positive for abdominal pain. Negative for blood in stool, constipation, diarrhea, nausea and vomiting.  Genitourinary: Negative for dysuria.  Skin: Negative for rash.  Neurological: Negative for dizziness and loss of consciousness.  All other systems reviewed and are negative.    Family History  Problem Relation Age of Onset  . Hypotension Mother        also hypoglycemia  . Migraines Mother   . Diabetes Father   . Kidney disease Father   . Diabetes Brother        type I  . Mental illness Brother        mental disorder; mother with custody.  . Kidney failure Brother     Past Medical History:  Diagnosis Date  . Abnormal blood chemistry   . Allergy   . AR (allergic rhinitis)   . Depression   . Female infertility   . Goiter   . Irregular  menses    chronic  . Knee pain, bilateral   . Neoplasm    benign skin  . Spontaneous miscarriage   . Tobacco use disorder   . Viral warts     Past Surgical History:  Procedure Laterality Date  . tubes in ear      Social History:  reports that she has quit smoking. She has a 14.00 pack-year smoking history. She has never used smokeless tobacco. She reports current alcohol use. She reports that she does not use drugs.  Allergies:  Allergies  Allergen Reactions  . Codeine Rash    Medications Prior to Admission  Medication Sig Dispense Refill  . ondansetron (ZOFRAN-ODT) 4 MG disintegrating tablet Take 4 mg by mouth every 8 (eight) hours as needed for nausea or vomiting.     . traMADol (ULTRAM) 50 MG tablet Take 50 mg by mouth every 6 (six) hours as needed for moderate pain or severe pain.     . Ferrous Fumarate (HEMOCYTE - 106 MG FE) 324 (106 Fe) MG TABS tablet Take 1 tablet (106 mg of iron total) by mouth 2 (two) times daily. (Patient not taking: Reported on 12/05/2018) 30 tablet 2  . HYDROcodone-acetaminophen (NORCO/VICODIN) 5-325 MG tablet Take 1 tablet by mouth every 4 (four) hours as needed. (Patient not taking: Reported on 12/06/2018) 10 tablet 0    Blood pressure 112/79, pulse  76, temperature 98.2 F (36.8 C), temperature source Oral, resp. rate 17, height 5\' 7"  (1.702 m), weight 99.6 kg, SpO2 100 %, unknown if currently breastfeeding.  Physical Exam Vitals signs and nursing note reviewed.  Constitutional:      General: She is not in acute distress.    Appearance: Normal appearance. She is obese. She is not diaphoretic.  HENT:     Head: Normocephalic and atraumatic.     Nose: Nose normal.     Mouth/Throat:     Comments: Pt wearing mask Eyes:     General: No scleral icterus.       Right eye: No discharge.        Left eye: No discharge.     Conjunctiva/sclera: Conjunctivae normal.     Pupils: Pupils are equal, round, and reactive to light.  Neck:     Musculoskeletal:  Normal range of motion and neck supple.  Cardiovascular:     Rate and Rhythm: Normal rate and regular rhythm.     Pulses:          Radial pulses are 2+ on the right side and 2+ on the left side.     Heart sounds: Normal heart sounds. No murmur. No friction rub. No gallop.   Pulmonary:     Effort: Pulmonary effort is normal. No respiratory distress.     Breath sounds: Normal breath sounds. No decreased breath sounds, wheezing, rhonchi or rales.  Abdominal:     General: Bowel sounds are normal. There is no distension.     Palpations: Abdomen is soft. Abdomen is not rigid. There is no hepatomegaly or splenomegaly.     Tenderness: There is abdominal tenderness in the right upper quadrant. There is no guarding or rebound. Negative signs include Murphy's sign.  Musculoskeletal: Normal range of motion.        General: No tenderness or deformity.  Skin:    General: Skin is warm and dry.     Findings: No rash.  Neurological:     Mental Status: She is alert and oriented to person, place, and time.  Psychiatric:        Mood and Affect: Mood normal.        Behavior: Behavior normal.     Results for orders placed or performed during the hospital encounter of 12/06/18 (from the past 48 hour(s))  CBC     Status: Abnormal   Collection Time: 12/06/18  3:30 PM  Result Value Ref Range   WBC 11.3 (H) 4.0 - 10.5 K/uL   RBC 4.27 3.87 - 5.11 MIL/uL   Hemoglobin 13.4 12.0 - 15.0 g/dL   HCT 39.9 36.0 - 46.0 %   MCV 93.4 80.0 - 100.0 fL   MCH 31.4 26.0 - 34.0 pg   MCHC 33.6 30.0 - 36.0 g/dL   RDW 12.5 11.5 - 15.5 %   Platelets 320 150 - 400 K/uL   nRBC 0.0 0.0 - 0.2 %    Comment: Performed at [facility], [place] [address], [place], [place] [address]  Creatinine, serum     Status: None   Collection Time: 12/06/18  3:30 PM  Result Value Ref Range   Creatinine, Ser 0.70 0.44 - 1.00 mg/dL   GFR calc non Af Amer >60 >60 mL/min   GFR calc Af Amer >60 >60 mL/min    Comment: Performed  at [facility], [place] [address] [place], [place] [address]   US Abdomen Limited  Result Date:  12/05/2018 CLINICAL DATA:  Right shoulder pain EXAM: ULTRASOUND ABDOMEN LIMITED RIGHT UPPER QUADRANT COMPARISON:  None. FINDINGS: Gallbladder: Layering calcified gallstone is present measuring 1.6 cm. No gallbladder wall thickening. No sonographic Murphy sign noted by sonographer. Common bile duct: Diameter: 2.9 mm Liver: No focal lesion identified. Within normal limits in parenchymal echogenicity. Portal vein is patent on color Doppler imaging with normal direction of blood flow towards the liver. Other: None. IMPRESSION: Cholelithiasis without evidence of acute cholecystitis. Electronically Signed   By: Prudencio Pair M.D.   On: 12/05/2018 03:04    Assessment/Plan Active Problems:   Biliary colic  Biliary cholic possible cholecystitis  - Plan for OR tomorrow if no emergent cases take precedence  - am labs  FEN: CLD, NPO at midnight VTE: SCD's, heparin ID: Rocephin Foley: none Follow up: TBD  Kalman Drape, [facility] Surgery 12/06/2018, 4:57 PM Please see amion for pager for the following: M, T, W, & Friday 7:00am - 4:30pm Thursdays 7:00am -11:30am  Agree with above.  Unclear the timing for surgery - because there a lot of cases to be done.  Alphonsa Overall, MD, [facility] Surgery Office phone:  [phone]

## 2018-12-07 DIAGNOSIS — Z87891 Personal history of nicotine dependence: Secondary | ICD-10-CM | POA: Diagnosis not present

## 2018-12-07 DIAGNOSIS — Z6834 Body mass index (BMI) 34.0-34.9, adult: Secondary | ICD-10-CM | POA: Diagnosis not present

## 2018-12-07 DIAGNOSIS — Z79899 Other long term (current) drug therapy: Secondary | ICD-10-CM | POA: Diagnosis not present

## 2018-12-07 DIAGNOSIS — F329 Major depressive disorder, single episode, unspecified: Secondary | ICD-10-CM | POA: Diagnosis present

## 2018-12-07 DIAGNOSIS — Z885 Allergy status to narcotic agent status: Secondary | ICD-10-CM | POA: Diagnosis not present

## 2018-12-07 DIAGNOSIS — J309 Allergic rhinitis, unspecified: Secondary | ICD-10-CM | POA: Diagnosis present

## 2018-12-07 DIAGNOSIS — E669 Obesity, unspecified: Secondary | ICD-10-CM | POA: Diagnosis present

## 2018-12-07 DIAGNOSIS — Z20828 Contact with and (suspected) exposure to other viral communicable diseases: Secondary | ICD-10-CM | POA: Diagnosis present

## 2018-12-07 DIAGNOSIS — Z79891 Long term (current) use of opiate analgesic: Secondary | ICD-10-CM | POA: Diagnosis not present

## 2018-12-07 DIAGNOSIS — N921 Excessive and frequent menstruation with irregular cycle: Secondary | ICD-10-CM | POA: Diagnosis present

## 2018-12-07 DIAGNOSIS — K8020 Calculus of gallbladder without cholecystitis without obstruction: Secondary | ICD-10-CM | POA: Diagnosis present

## 2018-12-07 LAB — CBC
HCT: 38.8 % (ref 36.0–46.0)
Hemoglobin: 12.6 g/dL (ref 12.0–15.0)
MCH: 31.1 pg (ref 26.0–34.0)
MCHC: 32.5 g/dL (ref 30.0–36.0)
MCV: 95.8 fL (ref 80.0–100.0)
Platelets: 298 10*3/uL (ref 150–400)
RBC: 4.05 MIL/uL (ref 3.87–5.11)
RDW: 12.5 % (ref 11.5–15.5)
WBC: 8.7 10*3/uL (ref 4.0–10.5)
nRBC: 0 % (ref 0.0–0.2)

## 2018-12-07 LAB — PROTIME-INR
INR: 1 (ref 0.8–1.2)
Prothrombin Time: 13.4 seconds (ref 11.4–15.2)

## 2018-12-07 LAB — COMPREHENSIVE METABOLIC PANEL
ALT: 21 U/L (ref 0–44)
AST: 16 U/L (ref 15–41)
Albumin: 3.2 g/dL — ABNORMAL LOW (ref 3.5–5.0)
Alkaline Phosphatase: 61 U/L (ref 38–126)
Anion gap: 9 (ref 5–15)
BUN: 10 mg/dL (ref 6–20)
CO2: 19 mmol/L — ABNORMAL LOW (ref 22–32)
Calcium: 7.8 mg/dL — ABNORMAL LOW (ref 8.9–10.3)
Chloride: 105 mmol/L (ref 98–111)
Creatinine, Ser: 0.7 mg/dL (ref 0.44–1.00)
GFR calc Af Amer: >60 mL/min (ref >60)
GFR calc non Af Amer: >60 mL/min (ref >60)
Glucose, Bld: 96 mg/dL (ref 70–99)
Potassium: 4.1 mmol/L (ref 3.5–5.1)
Sodium: 133 mmol/L — ABNORMAL LOW (ref 135–145)
Total Bilirubin: 0.7 mg/dL (ref 0.3–1.2)
Total Protein: 6.3 g/dL — ABNORMAL LOW (ref 6.5–8.1)

## 2018-12-07 NOTE — Plan of Care (Signed)
Patient lying in bed this morning; pain controlled. No needs expressed at this time. Will continue to monitor.

## 2018-12-07 NOTE — Progress Notes (Addendum)
[facility] [place] [facility]/Trauma Progress Note      Assessment/Plan Biliary cholic possible cholecystitis  - Plan for OR today if no emergent cases take precedence  - labs this am WNL  FEN: NPO VTE: SCD's, heparin ID: Rocephin Foley: none Follow up: TBD  DISPO: pending OR schedule, pt may need to be transferred to [facility] to get surgery. May be able to get pt on the schedule here later today. If not OR today, hopefully tomorrow.     LOS: 1 day    Subjective: CC: no complaints  Pt is not having abdominal pain. No N, V, fever or chills overnight. She states she does not hurt lying still. She has not been OOB except to use the restroom. Discussed OR schedule and that we will try to get her surgery today. May be tomorrow. She expressed understanding.   Objective: Vital signs in last 24 hours: Temp:  [97.7 F (36.5 C)-98.9 F (37.2 C)] 98.9 F (37.2 C) (12/03 0614) Pulse Rate:  [67-76] 68 (12/03 0614) Resp:  [14-20] 14 (12/03 0614) BP: (103-112)/(62-79) 104/62 (12/03 0614) SpO2:  [96 %-100 %] 99 % (12/03 0614) Weight:  [99.6 kg] 99.6 kg (12/02 1424) Last BM Date: 12/07/18  Intake/Output from previous day: 12/02 0701 - 12/03 0700 In: 1894.1 [P.O.:700; I.V.:994.1; IV Piggyback:200] Out: 400 [Urine:400] Intake/Output this shift: No intake/output data recorded.  PE:  Gen:  Alert, NAD, pleasant, cooperative Card:  RRR, no M/G/R heard Pulm:  CTA, no W/R/R, effort normal Abd: Soft, NT/ND, +BS, no HSM Skin: no rashes noted, warm and dry   Anti-infectives: Anti-infectives (From admission, onward)   Start     Dose/Rate Route Frequency Ordered Stop   12/06/18 1600  cefTRIAXone (ROCEPHIN) 2 g in sodium chloride 0.9 % 100 mL IVPB     2 g 200 mL/hr over 30 Minutes Intravenous Every 24 hours 12/06/18 1503        Lab Results:  Recent Labs    12/06/18 1530 12/07/18 0328  WBC 11.3* 8.7  HGB 13.4 12.6  HCT 39.9 38.8  PLT 320 298   BMET Recent Labs    12/04/18 2110  12/06/18 1530 12/07/18 0328  NA 138  --  133*  K 3.6  --  4.1  CL 108  --  105  CO2 23  --  19*  GLUCOSE 82  --  96  BUN 13  --  10  CREATININE 0.69 0.70 0.70  CALCIUM 8.6*  --  7.8*   PT/INR Recent Labs    12/07/18 0328  LABPROT 13.4  INR 1.0   CMP     Component Value Date/Time   NA 133 (L) 12/07/2018 0328   NA 136 11/09/2013 1952   K 4.1 12/07/2018 0328   K 3.6 11/09/2013 1952   CL 105 12/07/2018 0328   CL 106 11/09/2013 1952   CO2 19 (L) 12/07/2018 0328   CO2 23 11/09/2013 1952   GLUCOSE 96 12/07/2018 0328   GLUCOSE 105 (H) 11/09/2013 1952   BUN 10 12/07/2018 0328   BUN 7 11/09/2013 1952   CREATININE 0.70 12/07/2018 0328   CREATININE 0.66 10/25/2014 0911   CALCIUM 7.8 (L) 12/07/2018 0328   CALCIUM 8.5 11/09/2013 1952   PROT 6.3 (L) 12/07/2018 0328   PROT 6.8 11/01/2011 1324   ALBUMIN 3.2 (L) 12/07/2018 0328   ALBUMIN 2.6 (L) 11/01/2011 1324   AST 16 12/07/2018 0328   AST 17 11/01/2011 1324   ALT 21 12/07/2018 0328   ALT  22 11/01/2011 1324   ALKPHOS 61 12/07/2018 0328   ALKPHOS 159 (H) 11/01/2011 1324   BILITOT 0.7 12/07/2018 0328   BILITOT 0.3 11/01/2011 1324   GFRNONAA >60 12/07/2018 0328   GFRNONAA >60 11/09/2013 1952   GFRNONAA >89 10/17/2013 1358   GFRAA >60 12/07/2018 0328   GFRAA >60 11/09/2013 1952   GFRAA >89 10/17/2013 1358   Lipase     Component Value Date/Time   LIPASE 30 12/04/2018 2110   LIPASE 118 11/01/2011 1324    Studies/Results: No results found.   Kalman Drape, PA-C [facility] Surgery Please see amion for pager for the following: M, T, W, & Friday 7:00am - 4:30pm Thursdays 7:00am -11:30am  Agree with above.  She is doing well though she is still hurting in her right back.  Her WBC is normal.  I talked to her about going home.  It is uncertain whether I will get to her surgery tomorrow.  I have had more urgent patients who need surgery the last 2 days.  The plan is to see what my schedule is tomorrow.  If I  cannot do her surgery tomorrow, will discharge home and try to set up outpatient surgery.  Her husband was in the room.  I gave him a note about her admission.  Alphonsa Overall, MD, [facility] Surgery Office phone:  [phone]

## 2018-12-08 ENCOUNTER — Inpatient Hospital Stay (HOSPITAL_COMMUNITY): Admitting: Certified Registered"

## 2018-12-08 ENCOUNTER — Encounter (HOSPITAL_COMMUNITY): Admission: AD | Disposition: A | Discharge status: Home / Self Care | Payer: Self-pay | Source: Ambulatory Visit

## 2018-12-08 ENCOUNTER — Encounter (HOSPITAL_COMMUNITY): Payer: Self-pay

## 2018-12-08 HISTORY — PX: CHOLECYSTECTOMY: SHX55

## 2018-12-08 SURGERY — LAPAROSCOPIC CHOLECYSTECTOMY
Anesthesia: General | Site: Abdomen

## 2018-12-08 MED ORDER — MEPERIDINE HCL 25 MG/ML IJ SOLN: 6.2500 mg | INTRAMUSCULAR | Status: DC | PRN

## 2018-12-08 MED ORDER — FENTANYL CITRATE (PF) 250 MCG/5ML IJ SOLN
INTRAMUSCULAR | Status: AC
  Filled 2018-12-08: qty 5

## 2018-12-08 MED ORDER — SCOPOLAMINE 1 MG/3DAYS TD PT72
1.0000 | MEDICATED_PATCH | Freq: Once | TRANSDERMAL | Status: DC
  Administered 2018-12-08: 09:00:00 1.5 mg via TRANSDERMAL

## 2018-12-08 MED ORDER — ROCURONIUM BROMIDE 50 MG/5ML IV SOSY
PREFILLED_SYRINGE | INTRAVENOUS | Status: DC | PRN
  Administered 2018-12-08: 60 mg via INTRAVENOUS

## 2018-12-08 MED ORDER — LACTATED RINGERS IV SOLN
INTRAVENOUS | Status: DC
  Administered 2018-12-08 (×2): via INTRAVENOUS

## 2018-12-08 MED ORDER — HYDROMORPHONE HCL 1 MG/ML IJ SOLN
0.2500 mg | INTRAMUSCULAR | Status: DC | PRN
  Administered 2018-12-08: 0.5 mg via INTRAVENOUS

## 2018-12-08 MED ORDER — STERILE WATER FOR IRRIGATION IR SOLN
Status: DC | PRN
  Administered 2018-12-08: 1000 mL

## 2018-12-08 MED ORDER — LIDOCAINE 2% (20 MG/ML) 5 ML SYRINGE
INTRAMUSCULAR | Status: DC | PRN
  Administered 2018-12-08: 50 mg via INTRAVENOUS

## 2018-12-08 MED ORDER — DEXMEDETOMIDINE HCL IN NACL 400 MCG/100ML IV SOLN
INTRAVENOUS | Status: DC | PRN
  Administered 2018-12-08 (×2): 4 ug via INTRAVENOUS

## 2018-12-08 MED ORDER — TRAMADOL HCL 50 MG PO TABS: 50.0000 mg | ORAL_TABLET | Freq: Four times a day (QID) | ORAL | 0 refills | Status: AC | PRN

## 2018-12-08 MED ORDER — PROPOFOL 10 MG/ML IV BOLUS
INTRAVENOUS | Status: DC | PRN
  Administered 2018-12-08: 30 mg via INTRAVENOUS
  Administered 2018-12-08: 150 mg via INTRAVENOUS

## 2018-12-08 MED ORDER — DEXAMETHASONE SODIUM PHOSPHATE 10 MG/ML IJ SOLN
INTRAMUSCULAR | Status: DC | PRN
  Administered 2018-12-08: 10 mg via INTRAVENOUS

## 2018-12-08 MED ORDER — PROPOFOL 10 MG/ML IV BOLUS
INTRAVENOUS | Status: AC
  Filled 2018-12-08: qty 20

## 2018-12-08 MED ORDER — SCOPOLAMINE 1 MG/3DAYS TD PT72
MEDICATED_PATCH | TRANSDERMAL | Status: AC
  Administered 2018-12-08: 1.5 mg via TRANSDERMAL
  Filled 2018-12-08: qty 1

## 2018-12-08 MED ORDER — PROMETHAZINE HCL 25 MG/ML IJ SOLN: 6.2500 mg | INTRAMUSCULAR | Status: DC | PRN

## 2018-12-08 MED ORDER — LIDOCAINE 2% (20 MG/ML) 5 ML SYRINGE
INTRAMUSCULAR | Status: AC
  Filled 2018-12-08: qty 5

## 2018-12-08 MED ORDER — MIDAZOLAM HCL 2 MG/2ML IJ SOLN: 0.5000 mg | Freq: Once | INTRAMUSCULAR | Status: DC | PRN

## 2018-12-08 MED ORDER — DEXAMETHASONE SODIUM PHOSPHATE 10 MG/ML IJ SOLN
INTRAMUSCULAR | Status: AC
  Filled 2018-12-08: qty 1

## 2018-12-08 MED ORDER — MIDAZOLAM HCL 2 MG/2ML IJ SOLN
INTRAMUSCULAR | Status: AC
  Filled 2018-12-08: qty 2

## 2018-12-08 MED ORDER — ONDANSETRON HCL 4 MG/2ML IJ SOLN
INTRAMUSCULAR | Status: AC
  Filled 2018-12-08: qty 2

## 2018-12-08 MED ORDER — SUCCINYLCHOLINE CHLORIDE 200 MG/10ML IV SOSY
PREFILLED_SYRINGE | INTRAVENOUS | Status: AC
  Filled 2018-12-08: qty 10

## 2018-12-08 MED ORDER — ACETAMINOPHEN 10 MG/ML IV SOLN
INTRAVENOUS | Status: AC
  Filled 2018-12-08: qty 100

## 2018-12-08 MED ORDER — HYDROMORPHONE HCL 1 MG/ML IJ SOLN
INTRAMUSCULAR | Status: AC
  Filled 2018-12-08: qty 1

## 2018-12-08 MED ORDER — MIDAZOLAM HCL 5 MG/5ML IJ SOLN
INTRAMUSCULAR | Status: DC | PRN
  Administered 2018-12-08: 2 mg via INTRAVENOUS

## 2018-12-08 MED ORDER — 0.9 % SODIUM CHLORIDE (POUR BTL) OPTIME
TOPICAL | Status: DC | PRN
  Administered 2018-12-08: 10:00:00 1000 mL

## 2018-12-08 MED ORDER — BUPIVACAINE-EPINEPHRINE 0.5% -1:200000 IJ SOLN
INTRAMUSCULAR | Status: AC
  Filled 2018-12-08: qty 1

## 2018-12-08 MED ORDER — SUGAMMADEX SODIUM 200 MG/2ML IV SOLN
INTRAVENOUS | Status: DC | PRN
  Administered 2018-12-08: 200 mg via INTRAVENOUS

## 2018-12-08 MED ORDER — ACETAMINOPHEN 10 MG/ML IV SOLN
INTRAVENOUS | Status: DC | PRN
  Administered 2018-12-08: 1000 mg via INTRAVENOUS

## 2018-12-08 MED ORDER — FENTANYL CITRATE (PF) 100 MCG/2ML IJ SOLN
INTRAMUSCULAR | Status: DC | PRN
  Administered 2018-12-08: 150 ug via INTRAVENOUS
  Administered 2018-12-08: 50 ug via INTRAVENOUS

## 2018-12-08 MED ORDER — BUPIVACAINE-EPINEPHRINE (PF) 0.5% -1:200000 IJ SOLN
INTRAMUSCULAR | Status: DC | PRN
  Administered 2018-12-08: 6 mL via PERINEURAL

## 2018-12-08 MED ORDER — ROCURONIUM BROMIDE 10 MG/ML (PF) SYRINGE
PREFILLED_SYRINGE | INTRAVENOUS | Status: AC
  Filled 2018-12-08: qty 10

## 2018-12-08 SURGICAL SUPPLY — 32 items
CANISTER SUCT 3000ML PPV (MISCELLANEOUS) ×3 IMPLANT
CHLORAPREP W/TINT 26 (MISCELLANEOUS) ×3 IMPLANT
CLIP VESOLOCK MED LG 6/CT (CLIP) ×6 IMPLANT
COVER SURGICAL LIGHT HANDLE (MISCELLANEOUS) ×3 IMPLANT
COVER TRANSDUCER ULTRASND (DRAPES) ×3 IMPLANT
DERMABOND ADVANCED (GAUZE/BANDAGES/DRESSINGS) ×2
DERMABOND ADVANCED .7 DNX12 (GAUZE/BANDAGES/DRESSINGS) ×1 IMPLANT
ELECT REM PT RETURN 9FT ADLT (ELECTROSURGICAL) ×3
ELECTRODE REM PT RTRN 9FT ADLT (ELECTROSURGICAL) ×1 IMPLANT
GLOVE BIO SURGEON STRL SZ7.5 (GLOVE) ×3 IMPLANT
GOWN STRL REUS W/ TWL LRG LVL3 (GOWN DISPOSABLE) ×2 IMPLANT
GOWN STRL REUS W/ TWL XL LVL3 (GOWN DISPOSABLE) ×1 IMPLANT
GOWN STRL REUS W/TWL LRG LVL3 (GOWN DISPOSABLE) ×4
GOWN STRL REUS W/TWL XL LVL3 (GOWN DISPOSABLE) ×2
GRASPER SUT TROCAR 14GX15 (MISCELLANEOUS) ×3 IMPLANT
KIT BASIN OR (CUSTOM PROCEDURE TRAY) ×3 IMPLANT
KIT TURNOVER KIT B (KITS) ×3 IMPLANT
NEEDLE INSUFFLATION 14GA 120MM (NEEDLE) ×3 IMPLANT
NS IRRIG 1000ML POUR BTL (IV SOLUTION) ×3 IMPLANT
PAD ARMBOARD 7.5X6 YLW CONV (MISCELLANEOUS) ×3 IMPLANT
SCISSORS LAP 5X35 DISP (ENDOMECHANICALS) ×3 IMPLANT
SET IRRIG TUBING LAPAROSCOPIC (IRRIGATION / IRRIGATOR) ×3 IMPLANT
SET TUBE SMOKE EVAC HIGH FLOW (TUBING) ×3 IMPLANT
SLEEVE ENDOPATH XCEL 5M (ENDOMECHANICALS) ×3 IMPLANT
SPECIMEN JAR SMALL (MISCELLANEOUS) ×3 IMPLANT
SUT MNCRL AB 4-0 PS2 18 (SUTURE) ×3 IMPLANT
TOWEL GREEN STERILE (TOWEL DISPOSABLE) ×3 IMPLANT
TOWEL GREEN STERILE FF (TOWEL DISPOSABLE) ×3 IMPLANT
TRAY LAPAROSCOPIC MC (CUSTOM PROCEDURE TRAY) ×3 IMPLANT
TROCAR XCEL NON-BLD 11X100MML (ENDOMECHANICALS) ×3 IMPLANT
TROCAR XCEL NON-BLD 5MMX100MML (ENDOMECHANICALS) ×3 IMPLANT
WATER STERILE IRR 1000ML POUR (IV SOLUTION) ×3 IMPLANT

## 2018-12-08 NOTE — Progress Notes (Signed)
Report called in [place] at [facility] and called [facility] for transport patient to pre-op holding.

## 2018-12-08 NOTE — Op Note (Signed)
12/08/2018  10:25 AM  PATIENT:  Mcneil Sober  38 y.o. female  PRE-OPERATIVE DIAGNOSIS:  Cholecystitis  POST-OPERATIVE DIAGNOSIS:  Cholecystitis  PROCEDURE:  Procedure(s): LAPAROSCOPIC CHOLECYSTECTOMY (N/A)  SURGEON:  Surgeon(s) and Role:    Ralene Ok, MD - Primary  ANESTHESIA:   local and general  EBL:  minimal   BLOOD ADMINISTERED:none  DRAINS: none   LOCAL MEDICATIONS USED:  BUPIVICAINE   SPECIMEN:  Source of Specimen:  gallbladder  DISPOSITION OF SPECIMEN:  PATHOLOGY  COUNTS:  YES  TOURNIQUET:  * No tourniquets in log *  DICTATION: .Dragon Dictation  EBL: [id number]   Complications: none   Counts: reported as correct x 2   Findings:chronically inflamed gallbladder  Indications for procedure: Pt is a 69F with RUQ pain and seen to have gallstones and cholecystitis.   Details of the procedure: The patient was taken to the operating and placed in the supine position with bilateral SCDs in place. A time out was called and all facts were verified. A pneumoperitoneum was obtained via A Veress needle technique to a pressure of 76mm of mercury. A 48mm trochar was then placed in the right upper quadrant under visualization, and there were no injuries to any abdominal organs. A 11 mm port was then placed in the umbilical region after infiltrating with local anesthesia under direct visualization. A second epigastric port was placed under direct visualization.   The gallbladder was identified and retracted, the peritoneum was then sharply dissected from the gallbladder and this dissection was carried down to Calot's triangle. The cystic duct was identified and dissected circumferentially and seen going into the gallbladder 360.  The cystic artery was dissected away from the surrounding tissues.   The critical angle was obtained.    2 clips were placed proximally one distally and the cystic duct transected. The cystic artery was identified and 2 clips placed proximally and  one distally and transected. We then proceeded to remove the gallbladder off the hepatic fossa with Bovie cautery. A retrieval bag was then placed in the abdomen and gallbladder placed in the bag. The hepatic fossa was then reexamined and hemostasis was achieved with Bovie cautery and was excellent at this portion of the case. The subhepatic fossa and perihepatic fossa was then irrigated until the effluent was clear. The specimen bag and specimen were removed from the abdominal cavity.  The 11 mm trocar fascia was reapproximated with the Endo Close #1 Vicryl x2. The pneumoperitoneum was evacuated and all trochars removed under direct visulalization. The skin was then closed with 4-0 Monocryl and the skin dressed with Dermabond. The patient was awaken from general anesthesia and taken to the recovery room in stable condition.    PLAN OF CARE: Discharge to home after PACU  PATIENT DISPOSITION:  PACU - hemodynamically stable.   Delay start of Pharmacological VTE agent (>24hrs) due to surgical blood loss or risk of bleeding: not applicable

## 2018-12-08 NOTE — Anesthesia Procedure Notes (Signed)
Procedure Name: Intubation Date/Time: 12/08/2018 9:44 AM Performed by: Lavell Luster, CRNA Pre-anesthesia Checklist: Patient identified, Emergency Drugs available, Suction available, Patient being monitored and Timeout performed Patient Re-evaluated:Patient Re-evaluated prior to induction Oxygen Delivery Method: Circle system utilized Preoxygenation: Pre-oxygenation with 100% oxygen Induction Type: IV induction Ventilation: Mask ventilation without difficulty Laryngoscope Size: Mac and 3 Grade View: Grade I Tube type: Oral Tube size: 7.0 mm Number of attempts: 1 Airway Equipment and Method: Stylet Placement Confirmation: ETT inserted through vocal cords under direct vision,  positive ETCO2 and breath sounds checked- equal and bilateral Secured at: 22 cm Tube secured with: Tape Dental Injury: Teeth and Oropharynx as per pre-operative assessment

## 2018-12-08 NOTE — Anesthesia Postprocedure Evaluation (Signed)
Anesthesia Post Note  Patient: Sara Gonzales  Procedure(s) Performed: LAPAROSCOPIC CHOLECYSTECTOMY (N/A Abdomen)     Patient location during evaluation: PACU Anesthesia Type: General Level of consciousness: awake and alert, patient cooperative and oriented Pain management: pain level controlled Vital Signs Assessment: post-procedure vital signs reviewed and stable Respiratory status: spontaneous breathing, nonlabored ventilation and respiratory function stable Cardiovascular status: blood pressure returned to baseline and stable Postop Assessment: no apparent nausea or vomiting Anesthetic complications: no    Last Vitals:  Vitals:   12/08/18 1110 12/08/18 1125  BP: 120/71 120/71  Pulse: 68 65  Resp: (!) 22 20  Temp:    SpO2: 95% 97%    Last Pain:  Vitals:   12/08/18 1110  TempSrc:   PainSc: Asleep                 Djimon Lundstrom,E. Dayle Sherpa

## 2018-12-08 NOTE — Anesthesia Preprocedure Evaluation (Addendum)
Anesthesia Evaluation  Patient identified by MRN, date of birth, ID band Patient awake    Reviewed: Allergy & Precautions, NPO status , Patient's Chart, lab work & pertinent test results  History of Anesthesia Complications Negative for: history of anesthetic complications  Airway Mallampati: II  TM Distance: >3 FB Neck ROM: Full    Dental  (+) Dental Advisory Given, Teeth Intact   Pulmonary Patient abstained from smoking., former smoker,  12/06/2018 SARS coronavirus NEG   breath sounds clear to auscultation       Cardiovascular negative cardio ROS   Rhythm:Regular Rate:Normal  '17 ECHO: EF 55-60%, no valvular abnormalities   Neuro/Psych Depression    GI/Hepatic Neg liver ROS, Nausea and abd pain with acute chole   Endo/Other  Morbid obesity  Renal/GU negative Renal ROS     Musculoskeletal   Abdominal (+) + obese,   Peds  Hematology negative hematology ROS (+)   Anesthesia Other Findings   Reproductive/Obstetrics 12/04/2018 preg NEG                            Anesthesia Physical Anesthesia Plan  ASA: II  Anesthesia Plan: General   Post-op Pain Management:    Induction: Intravenous  PONV Risk Score and Plan: 3 and Ondansetron and Dexamethasone  Airway Management Planned: Oral ETT  Additional Equipment:   Intra-op Plan:   Post-operative Plan: Extubation in OR  Informed Consent: I have reviewed the patients History and Physical, chart, labs and discussed the procedure including the risks, benefits and alternatives for the proposed anesthesia with the patient or authorized representative who has indicated his/her understanding and acceptance.     Dental advisory given  Plan Discussed with: CRNA and Surgeon  Anesthesia Plan Comments:        Anesthesia Quick Evaluation

## 2018-12-08 NOTE — Progress Notes (Signed)
[facility] [place] [facility]/Trauma Progress Note  Day of Surgery   Assessment/Plan Biliary cholic possible cholecystitis  - Plan for OR today if no emergent cases take precedence - transfer to [facility], Dr. Rosendo Gros to accept for surgery - labs yesterday WNL  FEN:NPO VTE: SCD's,heparin [id number] Foley:none Follow up:TBD  DISPO: transfer to [facility] for surgery. Possible discharge from PACU    LOS: 2 days    Subjective: CC: no complaints  Pt denies abdominal pain, nausea, vomiting, fever or chills overnight.   Objective: Vital signs in last 24 hours: Temp:  [98.4 F (36.9 C)-98.6 F (37 C)] 98.6 F (37 C) (12/04 0649) Pulse Rate:  [74-81] 75 (12/04 0649) Resp:  [16-18] 16 (12/04 0649) BP: (104-117)/(63-76) 104/63 (12/04 0649) SpO2:  [99 %-100 %] 99 % (12/04 0649) Last BM Date: 12/07/18  Intake/Output from previous day: 12/03 0701 - 12/04 0700 In: 1160 [P.O.:360; I.V.:800] Out: 2950 [Urine:2950] Intake/Output this shift: No intake/output data recorded.  PE:  Gen:  Alert, NAD, pleasant, cooperative Card:  RRR, no M/G/R heard Pulm:  CTA, no W/R/R, effort normal Abd: Soft, NT/ND, +BS, no HSM Skin: no rashes noted, warm and dry   Anti-infectives: Anti-infectives (From admission, onward)   Start     Dose/Rate Route Frequency Ordered Stop   12/06/18 1600  cefTRIAXone (ROCEPHIN) 2 g in sodium chloride 0.9 % 100 mL IVPB     2 g 200 mL/hr over 30 Minutes Intravenous Every 24 hours 12/06/18 1503        Lab Results:  Recent Labs    12/06/18 1530 12/07/18 0328  WBC 11.3* 8.7  HGB 13.4 12.6  HCT 39.9 38.8  PLT 320 298   BMET Recent Labs    12/06/18 1530 12/07/18 0328  NA  --  133*  K  --  4.1  CL  --  105  CO2  --  19*  GLUCOSE  --  96  BUN  --  10  CREATININE 0.70 0.70  CALCIUM  --  7.8*   PT/INR Recent Labs    12/07/18 0328  LABPROT 13.4  INR 1.0   CMP     Component Value Date/Time   NA 133 (L) 12/07/2018 0328   NA 136 11/09/2013  1952   K 4.1 12/07/2018 0328   K 3.6 11/09/2013 1952   CL 105 12/07/2018 0328   CL 106 11/09/2013 1952   CO2 19 (L) 12/07/2018 0328   CO2 23 11/09/2013 1952   GLUCOSE 96 12/07/2018 0328   GLUCOSE 105 (H) 11/09/2013 1952   BUN 10 12/07/2018 0328   BUN 7 11/09/2013 1952   CREATININE 0.70 12/07/2018 0328   CREATININE 0.66 10/25/2014 0911   CALCIUM 7.8 (L) 12/07/2018 0328   CALCIUM 8.5 11/09/2013 1952   PROT 6.3 (L) 12/07/2018 0328   PROT 6.8 11/01/2011 1324   ALBUMIN 3.2 (L) 12/07/2018 0328   ALBUMIN 2.6 (L) 11/01/2011 1324   AST 16 12/07/2018 0328   AST 17 11/01/2011 1324   ALT 21 12/07/2018 0328   ALT 22 11/01/2011 1324   ALKPHOS 61 12/07/2018 0328   ALKPHOS 159 (H) 11/01/2011 1324   BILITOT 0.7 12/07/2018 0328   BILITOT 0.3 11/01/2011 1324   GFRNONAA >60 12/07/2018 0328   GFRNONAA >60 11/09/2013 1952   GFRNONAA >89 10/17/2013 1358   GFRAA >60 12/07/2018 0328   GFRAA >60 11/09/2013 1952   GFRAA >89 10/17/2013 1358   Lipase     Component Value Date/Time   LIPASE 30 12/04/2018 2110  LIPASE 118 11/01/2011 1324    Studies/Results: No results found.   Kalman Drape, PA-C [facility] Surgery Please see amion for pager for the following: Myna Hidalgo, W, & Friday 7:00am - 4:30pm Thursdays 7:00am -11:30am

## 2018-12-08 NOTE — Transfer of Care (Signed)
Immediate Anesthesia Transfer of Care Note  Patient: Sara Gonzales  Procedure(s) Performed: LAPAROSCOPIC CHOLECYSTECTOMY (N/A Abdomen)  Patient Location: PACU  Anesthesia Type:General  Level of Consciousness: awake, alert , oriented and patient cooperative  Airway & Oxygen Therapy: Patient connected to face mask oxygen  Post-op Assessment: Post -op Vital signs reviewed and stable  Post vital signs: stable  Last Vitals:  Vitals Value Taken Time  BP    Temp    Pulse 87 12/08/18 1042  Resp 26 12/08/18 1042  SpO2 98 % 12/08/18 1042  Vitals shown include unvalidated device data.  Last Pain:  Vitals:   12/08/18 0649  TempSrc: Oral  PainSc:       Patients Stated Pain Goal: 0 ([id number])  Complications: No apparent anesthesia complications

## 2018-12-08 NOTE — Discharge Instructions (Signed)

## 2018-12-09 ENCOUNTER — Encounter (HOSPITAL_COMMUNITY): Payer: Self-pay | Admitting: General Surgery

## 2018-12-11 LAB — SURGICAL PATHOLOGY

## 2018-12-12 NOTE — Discharge Summary (Addendum)
[facility] Discharge Summary   Patient ID: Sara Gonzales MRN: [id number] DOB/AGE: 1980-04-12 38 y.o.  Admit date: 12/06/2018 Discharge date: 12/08/2018  Admitting Diagnosis: Biliary cholic   Discharge Diagnosis Patient Active Problem List   Diagnosis Date Noted  . Biliary colic [id number]  . Acute chest pain 10/23/2015  . Supervision of normal pregnancy in third trimester 10/23/2015  . Pregnancy 07/14/2015  . Encounter for supervision of other normal pregnancy 07/14/2015  . Routine general medical examination at a health care facility 06/07/2012  . Routine gynecological examination 06/07/2012    Consultants none  Imaging: No results found.  Procedures Dr. Rosendo Gros - Laparoscopic Cholecystectomy   HPI: Pt is a 38 yo white female with no significant PMH who developed acute RUQ abdominal pain that radiated to her back on Friday. Associated nausea no emesis. She had a [place] which revealed a gallstone but no evidence of cholecystitis. Three days ago she began having the same type pain, but with no nausea or emesis this time.                         She presented to the ED yesterday but pain improved and she was sent home. She was seen in our office today and sent to [facility] as a direct admit by Dr. Dema Severin. [place] yesterday revealed gallstones, but no wall thickening, pericholecystic fluid, or evidence of cholecystitis. Pt is having continued pain without associated symptoms. She denies any fevers, chills, SOB, chest pain, cough, dysuria, diarrhea, etc.  Hospital Course:  Workup showed biliary cholic.  Patient was admitted and underwent procedure listed above. She required transfer to [facility] as there was no OR availability at [facility]. Pt tolerated procedure well. From PACU patient was stable and felt stable for discharge home.  Patient will follow up as outlined below and knows to call with questions or concerns.     I did not see this patient postop. I was  not involved in her discharge from the PACU.   Physical Exam: pre-operative  Gen: Alert, NAD, pleasant, cooperative Card: RRR, no M/G/R heard Pulm: CTA, no W/R/R, effort normal Abd: Soft, NT/ND, +BS, no HSM Skin: no rashes noted, warm and dry    Allergies as of 12/08/2018      Reactions   Codeine Rash      Medication List    TAKE these medications   HYDROcodone-acetaminophen 5-325 MG tablet Commonly known as: NORCO/VICODIN Take 1 tablet by mouth every 4 (four) hours as needed.   ondansetron 4 MG disintegrating tablet Commonly known as: ZOFRAN-ODT Take 4 mg by mouth every 8 (eight) hours as needed for nausea or vomiting.   traMADol 50 MG tablet Commonly known as: ULTRAM Take 50 mg by mouth every 6 (six) hours as needed for moderate pain or severe pain. What changed: Another medication with the same name was added. Make sure you understand how and when to take each.   traMADol 50 MG tablet Commonly known as: Ultram Take 1 tablet (50 mg total) by mouth every 6 (six) hours as needed. What changed: You were already taking a medication with the same name, and this prescription was added. Make sure you understand how and when to take each.     ASK your doctor about these medications   Ferrous Fumarate 324 (106 Fe) MG Tabs tablet Commonly known as: HEMOCYTE - 106 mg FE Take 1 tablet (106 mg of iron total) by mouth 2 (two)  times daily.        Follow-up [facility] Follow up on 12/21/2018.   Specialty: General Surgery Why: a provider will call you at 2:45pm for your telehealth appt. Please take a photo of your incisions and send to [email or web address] a few days prior. Please include your name and date of birth in the subject line Contact information: [address] [place] [phone]          Signed: [facility] 12/12/2018, 10:46  AM Please see amion for pager for the following: Cristine Polio, & Friday 7:00am - 4:30pm Thursdays 7:00am -11:30am

## 2021-07-28 IMAGING — US US ABDOMEN LIMITED
1 series · 14 of 25 positions shown · non-contrast
Comparison: None.

CLINICAL DATA: Right shoulder pain

EXAM:
ULTRASOUND ABDOMEN LIMITED RIGHT UPPER QUADRANT

[Series 1: us abdomen limited · 14 of 62 slices shown]
[im 1/62]
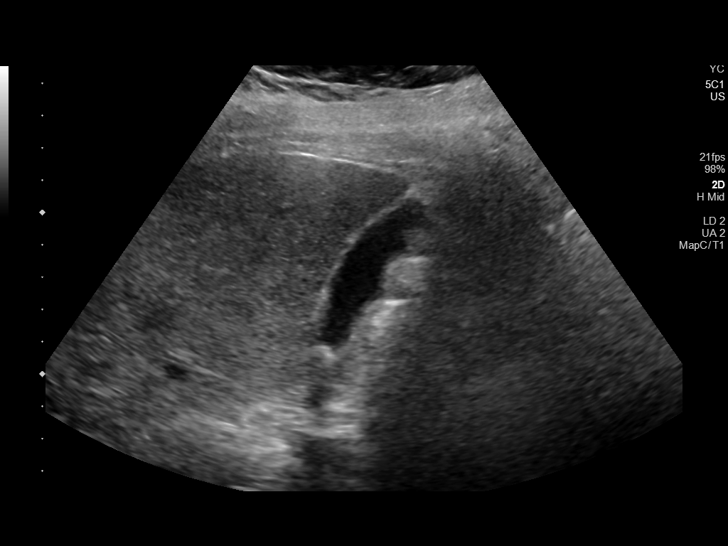
[im 6/62]
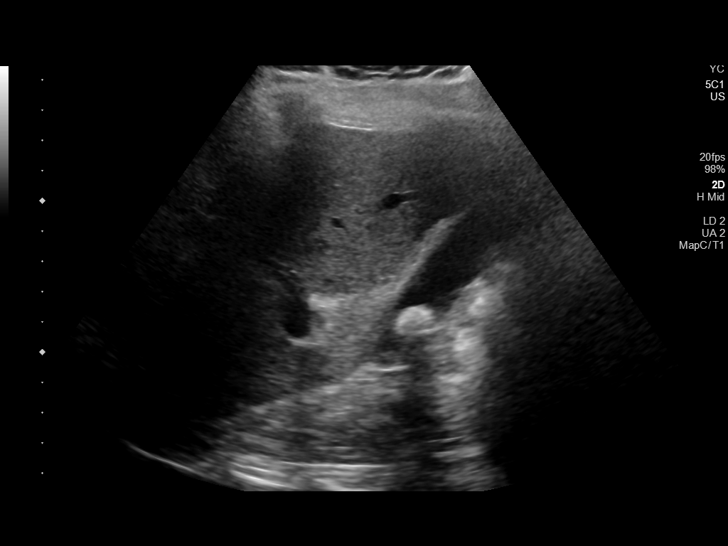
[im 11/62]
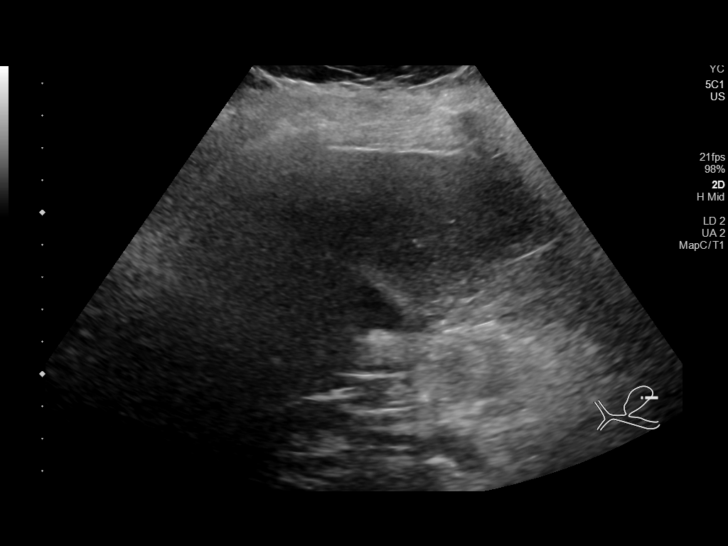
[im 16/62]
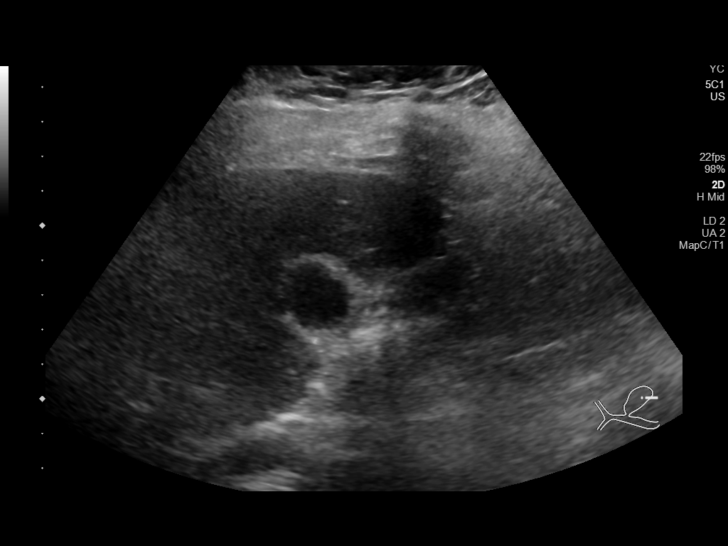
[im 21/62]
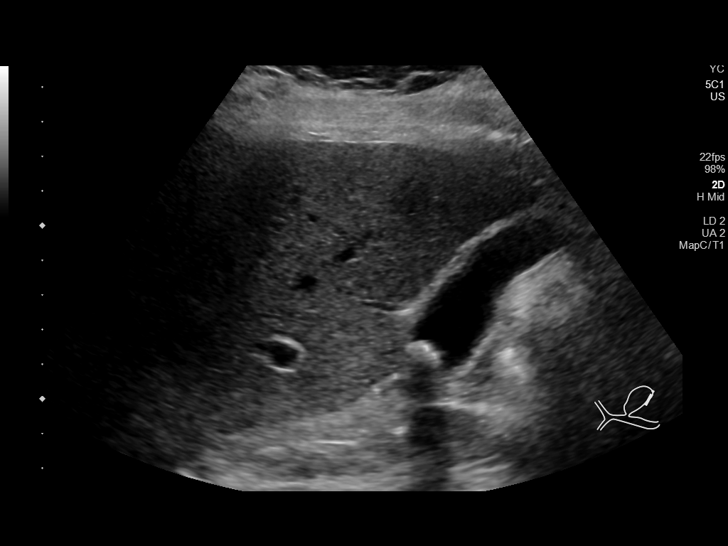
[im 23/62]
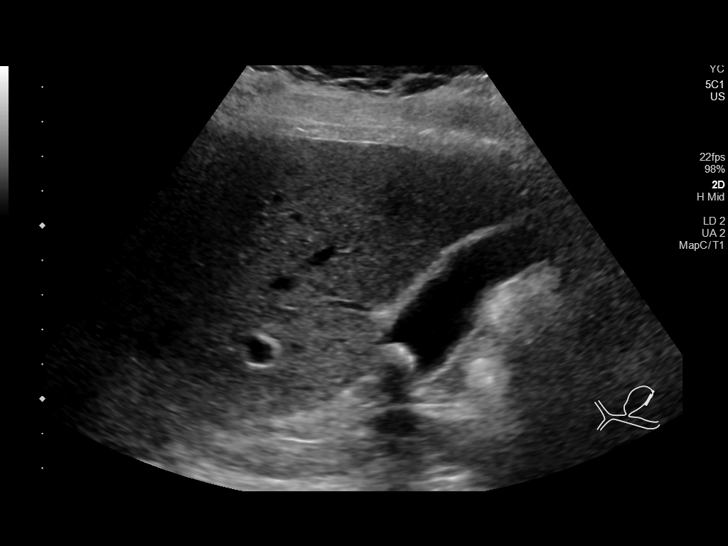
[im 28/62]
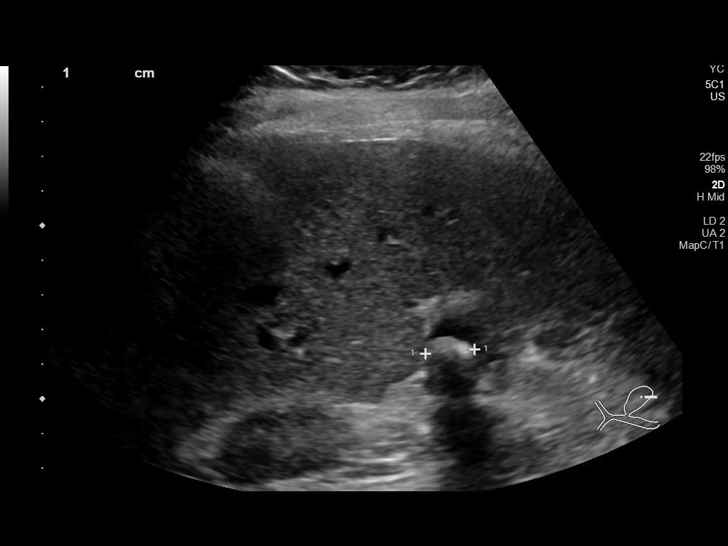
[im 34/62]
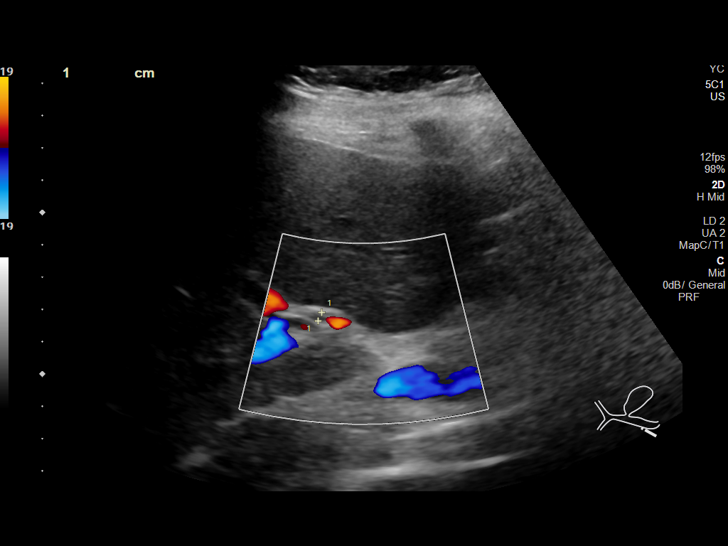
[im 39/62]
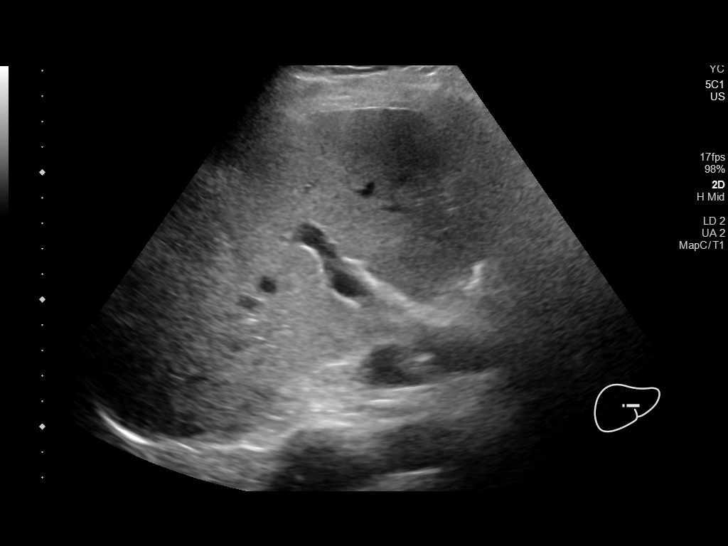
[im 41/62]
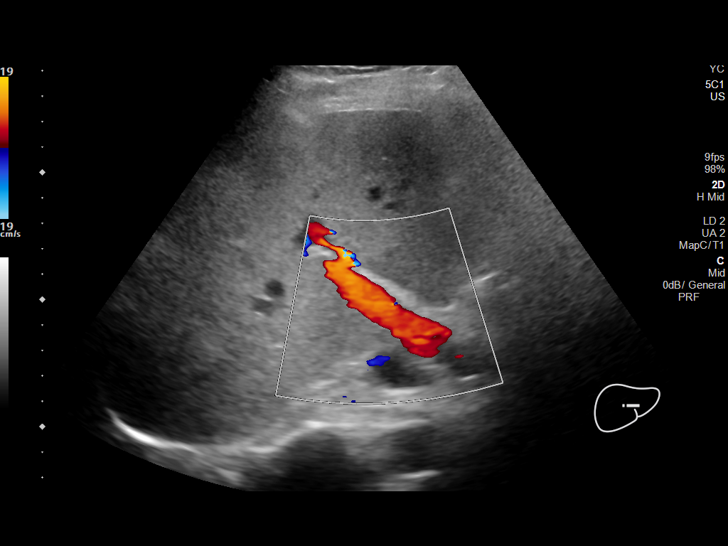
[im 46/62]
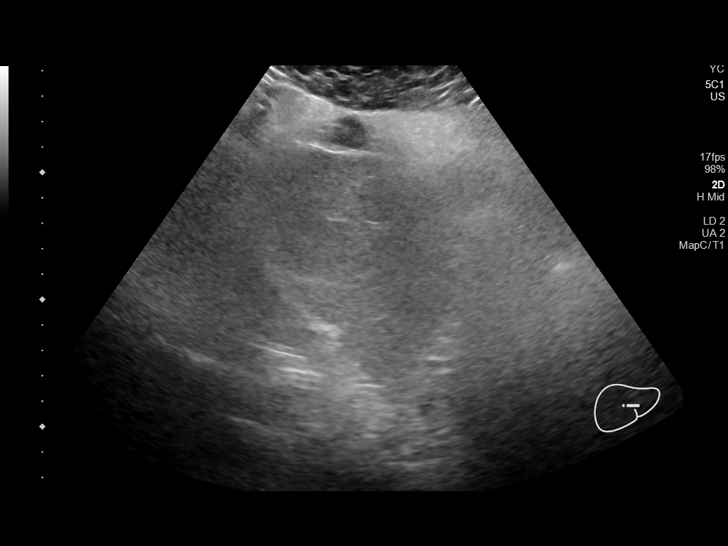
[im 51/62]
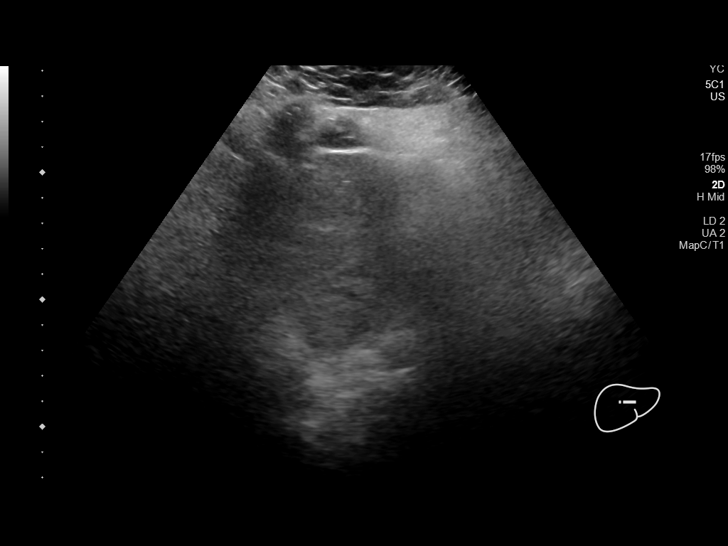
[im 56/62]
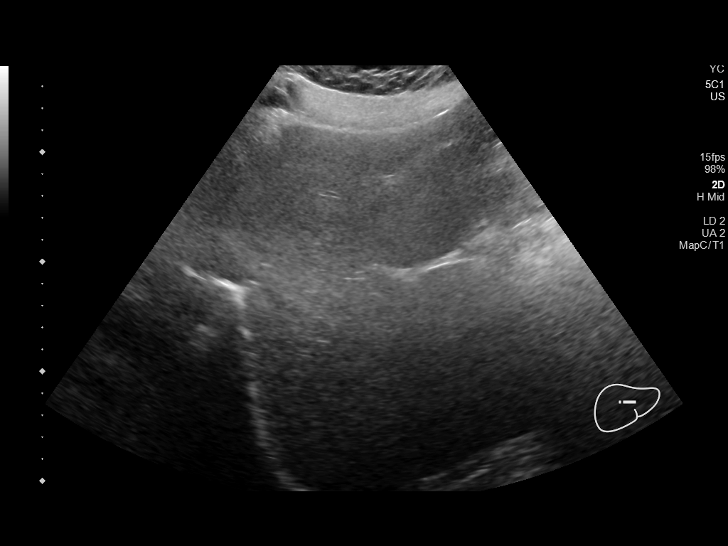
[im 62/62]
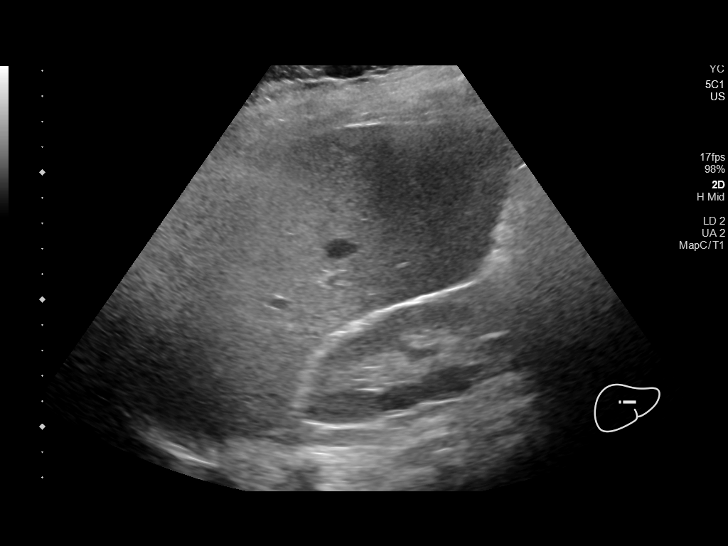

[14 of 25 positions shown; findings below may reference images not displayed]

FINDINGS: Gallbladder:

Layering calcified gallstone is present measuring 1.6 cm. No
gallbladder wall thickening. No sonographic Murphy sign noted by
sonographer.

Common bile duct:

Diameter: 2.9 mm

Liver:

No focal lesion identified. Within normal limits in parenchymal
echogenicity. Portal vein is patent on color Doppler imaging with
normal direction of blood flow towards the liver.

Other: None.
IMPRESSION: Cholelithiasis without evidence of acute cholecystitis.

## 2022-05-15 ENCOUNTER — Encounter: Payer: Self-pay | Admitting: Emergency Medicine

## 2022-05-15 ENCOUNTER — Ambulatory Visit: Admission: EM | Admit: 2022-05-15 | Discharge: 2022-05-15 | Disposition: A | Discharge status: Home / Self Care

## 2022-05-15 DIAGNOSIS — S86112A Strain of other muscle(s) and tendon(s) of posterior muscle group at lower leg level, left leg, initial encounter: Secondary | ICD-10-CM | POA: Diagnosis not present

## 2022-05-15 DIAGNOSIS — R29898 Other symptoms and signs involving the musculoskeletal system: Secondary | ICD-10-CM | POA: Diagnosis not present

## 2022-05-15 DIAGNOSIS — M79605 Pain in left leg: Secondary | ICD-10-CM

## 2022-05-15 MED ORDER — NAPROXEN 500 MG PO TABS: 500.0000 mg | ORAL_TABLET | Freq: Two times a day (BID) | ORAL | 0 refills | Status: AC

## 2022-05-15 MED ORDER — HYDROCODONE-ACETAMINOPHEN 7.5-325 MG PO TABS: 1.0000 | ORAL_TABLET | Freq: Four times a day (QID) | ORAL | 0 refills | Status: AC | PRN

## 2022-05-15 NOTE — Discharge Instructions (Addendum)
GASTROCNEMIUS RUPTURE:  I believe you ruptured your gastrocnemius muscle.  You will need imaging to assess for this through orthopedics.  Initial treatment involves rest until you can walk without limping. A walking boot may be needed for ambulation if the pain is severe.  We have put you in a walking boot.  You may take your leg out to ice it as needed.    You can try heel lifts that decrease dorsiflexion and stretch which may be helpful.   You should apply ice for 20 minutes four times daily to decrease swelling with an NSAID or acetaminophen to help with the pain.   Please contact orthopedics for follow-up.  You have a condition requiring you to follow up with Orthopedics so please call one of the following office for appointment:   [facility] [address] [place], [place] [address] Phone: [phone]  [facility] [address] [place], [place] [address] Phone: [phone]

## 2022-05-15 NOTE — ED Triage Notes (Signed)
Pt c/o left leg injury onset this morning about 1 hour ago. She states she felt a pop around the back of her knee. Pain radiates down to her toes. Pt has taken 600 mg of ibuprofen around 11:30 am.

## 2022-05-15 NOTE — ED Provider Notes (Signed)
[facility]    CSN: [id number] Arrival date & time: 05/15/22  1143      History   Chief Complaint Chief Complaint  Patient presents with   Leg Injury    HPI Sara Gonzales is a 42 y.o. female presenting with her family for evaluation of severe left lower leg/calf pain over the past hour.  Patient reports that she was at karate with her family and performing side-to-side movements with explosive jumping.  Reports that she felt an immediate sharp pain in her calf.  Reports that she has difficulty extending her knee and straightening her leg due to severe pain.  Cannot apply direct weight to the left leg.  No numbness.  No pain in her actual knee.  Reports a lot of swelling of her calf.  She has taken 600 mg of ibuprofen but says it did not help.  No other injuries.  HPI  Past Medical History:  Diagnosis Date   Abnormal blood chemistry    Allergy    AR (allergic rhinitis)    Depression    Female infertility    Goiter    Irregular menses    chronic   Knee pain, bilateral    Neoplasm    benign skin   Spontaneous miscarriage    Tobacco use disorder    Viral warts     Patient Active Problem List   Diagnosis Date Noted   Biliary colic 12/06/2018   Acute chest pain 10/23/2015   Supervision of normal pregnancy in third trimester 10/23/2015   Pregnancy 07/14/2015   Encounter for supervision of other normal pregnancy 07/14/2015   Routine general medical examination at a health care facility 06/07/2012   Routine gynecological examination 06/07/2012    Past Surgical History:  Procedure Laterality Date   CHOLECYSTECTOMY N/A 12/08/2018   Procedure: LAPAROSCOPIC CHOLECYSTECTOMY;  Surgeon: Axel Filler, MD;  Location: [facility] OR;  Service: General;  Laterality: N/A;   tubes in ear      OB History     Gravida  3   Para  2   Term  2   Preterm      AB  1   Living  2      SAB  1   IAB      Ectopic      Multiple  0   Live Births  2             Home Medications    Prior to Admission medications   Medication Sig Start Date End Date Taking? Authorizing Provider  HYDROcodone-acetaminophen (NORCO) 7.5-325 MG tablet Take 1 tablet by mouth every 6 (six) hours as needed for up to 5 days for moderate pain or severe pain. 05/15/22 05/20/22 Yes Shirlee Latch, PA-C  levonorgestrel (MIRENA) 20 MCG/DAY IUD 1 each by Intrauterine route once.   Yes [provider]  naproxen (NAPROSYN) 500 MG tablet Take 1 tablet (500 mg total) by mouth 2 (two) times daily for 15 days. 05/15/22 05/30/22 Yes Shirlee Latch, PA-C  Ferrous Fumarate (HEMOCYTE - 106 MG FE) 324 (106 Fe) MG TABS tablet Take 1 tablet (106 mg of iron total) by mouth 2 (two) times daily. Patient not taking: Reported on 12/05/2018 10/25/15   Sharee Pimple, CNM  ondansetron (ZOFRAN-ODT) 4 MG disintegrating tablet Take 4 mg by mouth every 8 (eight) hours as needed for nausea or vomiting.  12/01/18   [provider]    Family History Family History  Problem  Relation Age of Onset   Hypotension Mother        also hypoglycemia   Migraines Mother    Diabetes Father    Kidney disease Father    Diabetes Brother        type I   Mental illness Brother        mental disorder; mother with custody.   Kidney failure Brother     Social History Social History   Tobacco Use   Smoking status: Former    Packs/day: 1.00    Years: 14.00    Additional pack years: 0.00    Total pack years: 14.00    Types: Cigarettes   Smokeless tobacco: Never  Vaping Use   Vaping Use: Never used  Substance Use Topics   Alcohol use: Yes    Comment: socially   Drug use: No     Allergies   Codeine   Review of Systems Review of Systems  Musculoskeletal:  Positive for arthralgias, gait problem and joint swelling.  Skin:  Negative for color change and wound.  Neurological:  Negative for weakness and numbness.     Physical Exam Triage Vital Signs ED Triage Vitals  Enc Vitals  Group     BP      Pulse      Resp      Temp      Temp src      SpO2      Weight      Height      Head Circumference      Peak Flow      Pain Score      Pain Loc      Pain Edu?      Excl. in GC?    No data found.  Updated Vital Signs BP 107/77 (BP Location: Left Arm)   Pulse (!) 108   Temp 98.7 F (37.1 C) (Oral)   SpO2 97%      Physical Exam Vitals and nursing note reviewed.  Constitutional:      General: She is in acute distress (crying, in severe pain but very cooperative).     Appearance: Normal appearance. She is not ill-appearing or toxic-appearing.  HENT:     Head: Normocephalic and atraumatic.  Eyes:     General: No scleral icterus.       Right eye: No discharge.        Left eye: No discharge.     Conjunctiva/sclera: Conjunctivae normal.  Cardiovascular:     Rate and Rhythm: Normal rate and regular rhythm.     Pulses: Normal pulses.  Pulmonary:     Effort: Pulmonary effort is normal. No respiratory distress.  Musculoskeletal:     Cervical back: Neck supple.     Left knee: No swelling or bony tenderness. Normal range of motion.     Left lower leg: Swelling (moderate swelling arm firmness of left calf compared to right. Small contusion ove medial proximal calf) and tenderness (diffuse TTP of left calf) present.     Left ankle: No swelling. No tenderness.     Left Achilles Tendon: No tenderness. Thompson's test negative.  Skin:    General: Skin is dry.  Neurological:     General: No focal deficit present.     Mental Status: She is alert. Mental status is at baseline.     Motor: No weakness.     Gait: Gait abnormal (not bearing weight).  Psychiatric:        Mood  and Affect: Mood normal.        Behavior: Behavior normal.        Thought Content: Thought content normal.      UC Treatments / Results  Labs (all labs ordered are listed, but only abnormal results are displayed) Labs Reviewed - No data to display  EKG   Radiology No results  found.  Procedures Procedures (including critical care time)  Medications Ordered in UC Medications - No data to display  Initial Impression / Assessment and Plan / UC Course  I have reviewed the triage vital signs and the nursing notes.  Pertinent labs & imaging results that were available during my care of the patient were reviewed by me and considered in my medical decision making (see chart for details).   42 year old female presents for severe pain and swelling of left calf that began over the past hour after she sustained a jumping injury.  On exam she does have moderate swelling of the left posterior calf with significant tenderness to palpation throughout the calf.  She is crying but very cooperative.  She has a tiny bruise of the medial proximal calf, in the region of the medial head of the gastrocnemius.  Achilles tendon is intact and negative Thompson test.  She is not bearing weight at this time and has a lot of difficulty extending her knee.  High suspicion for gastrocnemius rupture.  Discussed this with patient.  She was placed in a tall CAM boot and given crutches. I reviewed RICE guidelines with her.  Will hold off on Toradol injection for acute pain relief as she just recently had ibuprofen 600 mg.  I did send Norco to pharmacy for severe pain.  I did advise her of the importance of following up with orthopedics.  She says she is considering going to orthopedics today.  If patient does indeed have a gastrocnemius rupture she may need extensive physical therapy and follow-ups with orthopedics.   Final Clinical Impressions(s) / UC Diagnoses   Final diagnoses:  Gastrocnemius muscle rupture, left, initial encounter  Left leg pain     Discharge Instructions      GASTROCNEMIUS RUPTURE:  I believe you ruptured your gastrocnemius muscle.  You will need imaging to assess for this through orthopedics.  Initial treatment involves rest until you can walk without limping. A  walking boot may be needed for ambulation if the pain is severe.  We have put you in a walking boot.  You may take your leg out to ice it as needed.    You can try heel lifts that decrease dorsiflexion and stretch which may be helpful.   You should apply ice for 20 minutes four times daily to decrease swelling with an NSAID or acetaminophen to help with the pain.   Please contact orthopedics for follow-up.  You have a condition requiring you to follow up with Orthopedics so please call one of the following office for appointment:   [facility] [address] [place], [place] [address] Phone: [phone]  [facility] [address] [place], [place] [address] Phone: [phone]      ED Prescriptions     Medication Sig Dispense Auth. Provider   HYDROcodone-acetaminophen (NORCO) 7.5-325 MG tablet Take 1 tablet by mouth every 6 (six) hours as needed for up to 5 days for moderate pain or severe pain. 20 tablet Eusebio Friendly B, PA-C   naproxen (NAPROSYN) 500 MG tablet Take 1 tablet (500 mg total) by mouth  2 (two) times daily for 15 days. 30 tablet Shirlee Latch, PA-C      I have reviewed the PDMP during this encounter.   Shirlee Latch, PA-C 05/15/22 1302

## 2022-11-23 ENCOUNTER — Other Ambulatory Visit: Payer: Self-pay | Admitting: Physician Assistant

## 2022-11-23 DIAGNOSIS — Z1231 Encounter for screening mammogram for malignant neoplasm of breast: Secondary | ICD-10-CM

## 2023-03-03 ENCOUNTER — Ambulatory Visit
Admission: RE | Admit: 2023-03-03 | Discharge: 2023-03-03 | Disposition: A | Discharge status: Home / Self Care | Source: Ambulatory Visit | Attending: Physician Assistant | Admitting: Physician Assistant

## 2023-03-03 DIAGNOSIS — Z1231 Encounter for screening mammogram for malignant neoplasm of breast: Secondary | ICD-10-CM | POA: Diagnosis present
